# Patient Record
Sex: Male | Born: 1965
Health system: Southern US, Community
[De-identification: ages and names within clinical notes are randomized; demographics above are authoritative.]

## PROBLEM LIST (undated history)

## (undated) DIAGNOSIS — L0390 Cellulitis, unspecified: Secondary | ICD-10-CM

## (undated) DIAGNOSIS — I10 Essential (primary) hypertension: Secondary | ICD-10-CM

## (undated) HISTORY — PX: TONSILLECTOMY: SUR1361

## (undated) HISTORY — DX: Essential (primary) hypertension: I10

---

## 1999-07-29 ENCOUNTER — Emergency Department (HOSPITAL_COMMUNITY): Admission: EM | Admit: 1999-07-29 | Discharge: 1999-07-29 | Payer: Self-pay | Admitting: Emergency Medicine

## 2001-08-30 ENCOUNTER — Emergency Department (HOSPITAL_COMMUNITY): Admission: EM | Admit: 2001-08-30 | Discharge: 2001-08-30 | Payer: Self-pay | Admitting: *Deleted

## 2004-12-01 ENCOUNTER — Emergency Department (HOSPITAL_COMMUNITY): Admission: EM | Admit: 2004-12-01 | Discharge: 2004-12-01 | Payer: Self-pay | Admitting: Emergency Medicine

## 2012-06-11 ENCOUNTER — Emergency Department (HOSPITAL_COMMUNITY)
Admission: EM | Admit: 2012-06-11 | Discharge: 2012-06-11 | Disposition: A | Discharge status: Home / Self Care | Payer: Self-pay | Attending: Emergency Medicine | Admitting: Emergency Medicine

## 2012-06-11 ENCOUNTER — Encounter (HOSPITAL_COMMUNITY): Payer: Self-pay | Admitting: Emergency Medicine

## 2012-06-11 DIAGNOSIS — R45851 Suicidal ideations: Secondary | ICD-10-CM | POA: Insufficient documentation

## 2012-06-11 NOTE — Progress Notes (Signed)
[facility] ED CM consulted with [facility] liaison Noted pt d/c spoke with triage RN, Stark Jock to confirm pt d/c back to [facility]
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
## 2012-06-11 NOTE — ED Notes (Addendum)
Per [employer]: Pt's mother called [employer] because pt was arguing with mother.  Pt states that he wants to be shot by cops.  Pt has lost his job, has "girl problems", and wanted to take his mom's TV to pawn it off.  Pt's mother is here.  Pt denies being suicidal right now but told [employer] that he wanted them to shoot him.  Pt is "emergency commitment".

## 2012-06-11 NOTE — ED Provider Notes (Signed)
Medical screening examination/treatment/procedure(s) were performed by non-physician practitioner and as supervising physician I was immediately available for consultation/collaboration.  Doug Sou, MD 06/11/12 1651

## 2014-01-02 ENCOUNTER — Emergency Department (HOSPITAL_COMMUNITY)
Admission: EM | Admit: 2014-01-02 | Discharge: 2014-01-02 | Disposition: A | Discharge status: Home / Self Care | Payer: Self-pay | Attending: Emergency Medicine | Admitting: Emergency Medicine

## 2014-01-02 ENCOUNTER — Encounter (HOSPITAL_COMMUNITY): Payer: Self-pay | Admitting: *Deleted

## 2014-01-02 DIAGNOSIS — Y998 Other external cause status: Secondary | ICD-10-CM | POA: Insufficient documentation

## 2014-01-02 DIAGNOSIS — Y92410 Unspecified street and highway as the place of occurrence of the external cause: Secondary | ICD-10-CM | POA: Insufficient documentation

## 2014-01-02 DIAGNOSIS — S199XXA Unspecified injury of neck, initial encounter: Secondary | ICD-10-CM | POA: Insufficient documentation

## 2014-01-02 DIAGNOSIS — Y9389 Activity, other specified: Secondary | ICD-10-CM | POA: Insufficient documentation

## 2014-01-02 DIAGNOSIS — R000 Tachycardia, unspecified: Secondary | ICD-10-CM | POA: Insufficient documentation

## 2014-01-02 MED ORDER — ACETAMINOPHEN 500 MG PO TABS
500.0000 mg | ORAL_TABLET | Freq: Four times a day (QID) | ORAL | Status: DC | PRN
Start: 2014-01-02 — End: 2021-11-06

## 2014-01-02 MED ORDER — ACETAMINOPHEN 325 MG PO TABS
650.0000 mg | ORAL_TABLET | Freq: Once | ORAL | Status: AC
  Administered 2014-01-02: 650 mg via ORAL
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
## 2014-12-22 ENCOUNTER — Encounter (HOSPITAL_COMMUNITY): Payer: Self-pay | Admitting: *Deleted

## 2014-12-22 ENCOUNTER — Emergency Department (HOSPITAL_COMMUNITY)
Admission: EM | Admit: 2014-12-22 | Discharge: 2014-12-22 | Discharge status: Against Medical Advice | Payer: Self-pay | Attending: Emergency Medicine | Admitting: Emergency Medicine

## 2014-12-22 DIAGNOSIS — K0889 Other specified disorders of teeth and supporting structures: Secondary | ICD-10-CM | POA: Insufficient documentation

## 2014-12-22 NOTE — ED Notes (Signed)
PT states that he began with achiness to his left lower jaw last Thurs; pt states that it has progressed to the left side of face hurting; pt states "I think my wisdom tooth is infected"

## 2015-09-01 ENCOUNTER — Ambulatory Visit: Payer: Self-pay | Admitting: Internal Medicine

## 2016-02-18 ENCOUNTER — Ambulatory Visit: Payer: Self-pay | Admitting: Nurse Practitioner

## 2016-03-10 ENCOUNTER — Encounter: Payer: Self-pay | Admitting: Nurse Practitioner

## 2016-03-10 ENCOUNTER — Encounter: Payer: Self-pay | Admitting: Gastroenterology

## 2016-03-10 ENCOUNTER — Ambulatory Visit (INDEPENDENT_AMBULATORY_CARE_PROVIDER_SITE_OTHER): Payer: BLUE CROSS/BLUE SHIELD | Admitting: Nurse Practitioner

## 2016-03-10 VITALS — BP 182/120 | HR 99 | Temp 98.4°F | Ht 78.0 in | Wt 233.0 lb

## 2016-03-10 DIAGNOSIS — Z23 Encounter for immunization: Secondary | ICD-10-CM | POA: Diagnosis not present

## 2016-03-10 DIAGNOSIS — Z125 Encounter for screening for malignant neoplasm of prostate: Secondary | ICD-10-CM | POA: Diagnosis not present

## 2016-03-10 DIAGNOSIS — I10 Essential (primary) hypertension: Secondary | ICD-10-CM | POA: Diagnosis not present

## 2016-03-10 DIAGNOSIS — Z1211 Encounter for screening for malignant neoplasm of colon: Secondary | ICD-10-CM

## 2016-03-10 DIAGNOSIS — L602 Onychogryphosis: Secondary | ICD-10-CM | POA: Diagnosis not present

## 2016-03-10 DIAGNOSIS — L603 Nail dystrophy: Secondary | ICD-10-CM | POA: Insufficient documentation

## 2016-03-10 DIAGNOSIS — Z0001 Encounter for general adult medical examination with abnormal findings: Secondary | ICD-10-CM

## 2016-03-10 DIAGNOSIS — L608 Other nail disorders: Secondary | ICD-10-CM

## 2016-03-10 MED ORDER — HYDROCHLOROTHIAZIDE 12.5 MG PO TABS: 12.5000 mg | ORAL_TABLET | Freq: Every day | ORAL | 3 refills | Status: DC

## 2016-03-10 MED ORDER — AMLODIPINE BESYLATE 5 MG PO TABS: 5.0000 mg | ORAL_TABLET | Freq: Every day | ORAL | 3 refills | Status: DC

## 2016-03-10 MED ORDER — ONE-A-DAY MENS PO TABS: 1.0000 | ORAL_TABLET | Freq: Every day | ORAL | 0 refills | Status: DC

## 2016-03-10 MED ORDER — CLONIDINE HCL 0.1 MG PO TABS
0.2000 mg | ORAL_TABLET | Freq: Once | ORAL | Status: AC
  Administered 2016-03-10: 0.2 mg via ORAL

## 2016-03-10 MED ORDER — B COMPLEX-C PO TABS: 1.0000 | ORAL_TABLET | Freq: Every day | ORAL | 0 refills | Status: DC

## 2016-03-10 MED ORDER — CLONIDINE HCL 0.1 MG PO TABS: 0.1000 mg | ORAL_TABLET | Freq: Once | ORAL | Status: DC

## 2016-03-10 NOTE — Progress Notes (Signed)
Pre visit review using our clinic review tool, if applicable. No additional management support is needed unless otherwise documented below in the visit note. 

## 2016-03-10 NOTE — Patient Instructions (Addendum)
Go to basement for blood draw. You will be called with results.  You will be called to schedule colonoscopy.  Start BP medications today.   Consider referral to dermatologist if labs are normal (to rule out psoriasis)  Hypertension Hypertension, commonly called high blood pressure, is when the force of blood pumping through your arteries is too strong. Your arteries are the blood vessels that carry blood from your heart throughout your body. A blood pressure reading consists of a higher number over a lower number, such as 110/72. The higher number (systolic) is the pressure inside your arteries when your heart pumps. The lower number (diastolic) is the pressure inside your arteries when your heart relaxes. Ideally you want your blood pressure below 120/80. Hypertension forces your heart to work harder to pump blood. Your arteries may become narrow or stiff. Having untreated or uncontrolled hypertension can cause heart attack, stroke, kidney disease, and other problems. What increases the risk? Some risk factors for high blood pressure are controllable. Others are not. Risk factors you cannot control include:  Race. You may be at higher risk if you are African American.  Age. Risk increases with age.  Gender. Men are at higher risk than women before age 13 years. After age 60, women are at higher risk than men. Risk factors you can control include:  Not getting enough exercise or physical activity.  Being overweight.  Getting too much fat, sugar, calories, or salt in your diet.  Drinking too much alcohol. What are the signs or symptoms? Hypertension does not usually cause signs or symptoms. Extremely high blood pressure (hypertensive crisis) may cause headache, anxiety, shortness of breath, and nosebleed. How is this diagnosed? To check if you have hypertension, your health care provider will measure your blood pressure while you are seated, with your arm held at the level of your  heart. It should be measured at least twice using the same arm. Certain conditions can cause a difference in blood pressure between your right and left arms. A blood pressure reading that is higher than normal on one occasion does not mean that you need treatment. If it is not clear whether you have high blood pressure, you may be asked to return on a different day to have your blood pressure checked again. Or, you may be asked to monitor your blood pressure at home for 1 or more weeks. How is this treated? Treating high blood pressure includes making lifestyle changes and possibly taking medicine. Living a healthy lifestyle can help lower high blood pressure. You may need to change some of your habits. Lifestyle changes may include:  Following the DASH diet. This diet is high in fruits, vegetables, and whole grains. It is low in salt, red meat, and added sugars.  Keep your sodium intake below 2,300 mg per day.  Getting at least 30-45 minutes of aerobic exercise at least 4 times per week.  Losing weight if necessary.  Not smoking.  Limiting alcoholic beverages.  Learning ways to reduce stress. Your health care provider may prescribe medicine if lifestyle changes are not enough to get your blood pressure under control, and if one of the following is true:  You are 9-66 years of age and your systolic blood pressure is above 140.  You are 52 years of age or older, and your systolic blood pressure is above 150.  Your diastolic blood pressure is above 90.  You have diabetes, and your systolic blood pressure is over 140 or your diastolic  blood pressure is over 90.  You have kidney disease and your blood pressure is above 140/90.  You have heart disease and your blood pressure is above 140/90. Your personal target blood pressure may vary depending on your medical conditions, your age, and other factors. Follow these instructions at home:  Have your blood pressure rechecked as directed by  your health care provider.  Take medicines only as directed by your health care provider. Follow the directions carefully. Blood pressure medicines must be taken as prescribed. The medicine does not work as well when you skip doses. Skipping doses also puts you at risk for problems.  Do not smoke.  Monitor your blood pressure at home as directed by your health care provider. Contact a health care provider if:  You think you are having a reaction to medicines taken.  You have recurrent headaches or feel dizzy.  You have swelling in your ankles.  You have trouble with your vision. Get help right away if:  You develop a severe headache or confusion.  You have unusual weakness, numbness, or feel faint.  You have severe chest or abdominal pain.  You vomit repeatedly.  You have trouble breathing. This information is not intended to replace advice given to you by your health care provider. Make sure you discuss any questions you have with your health care provider. Document Released: 01/16/2005 Document Revised: 06/24/2015 Document Reviewed: 11/08/2012 Elsevier Interactive Patient Education  2017 [employer]    Heart-Healthy Eating Plan Many factors influence your heart health, including eating and exercise habits. Heart (coronary) risk increases with abnormal blood fat (lipid) levels. Heart-healthy meal planning includes limiting unhealthy fats, increasing healthy fats, and making other small dietary changes. This includes maintaining a healthy body weight to help keep lipid levels within a normal range. What is my plan? Your health care provider recommends that you:  Get no more than _________% of the total calories in your daily diet from fat.  Limit your intake of saturated fat to less than _________% of your total calories each day.  Limit the amount of cholesterol in your diet to less than _________ mg per day. What types of fat should I choose?  Choose healthy fats  more often. Choose monounsaturated and polyunsaturated fats, such as olive oil and canola oil, flaxseeds, walnuts, almonds, and seeds.  Eat more omega-3 fats. Good choices include salmon, mackerel, sardines, tuna, flaxseed oil, and ground flaxseeds. Aim to eat fish at least two times each week.  Limit saturated fats. Saturated fats are primarily found in animal products, such as meats, butter, and cream. Plant sources of saturated fats include palm oil, palm kernel oil, and coconut oil.  Avoid foods with partially hydrogenated oils in them. These contain trans fats. Examples of foods that contain trans fats are stick margarine, some tub margarines, cookies, crackers, and other baked goods. What general guidelines do I need to follow?  Check food labels carefully to identify foods with trans fats or high amounts of saturated fat.  Fill one half of your plate with vegetables and green salads. Eat 4-5 servings of vegetables per day. A serving of vegetables equals 1 cup of raw leafy vegetables,  cup of raw or cooked cut-up vegetables, or  cup of vegetable juice.  Fill one fourth of your plate with whole grains. Look for the word "whole" as the first word in the ingredient list.  Fill one fourth of your plate with lean protein foods.  Eat 4-5 servings of fruit  per day. A serving of fruit equals one medium whole fruit,  cup of dried fruit,  cup of fresh, frozen, or canned fruit, or  cup of 100% fruit juice.  Eat more foods that contain soluble fiber. Examples of foods that contain this type of fiber are apples, broccoli, carrots, beans, peas, and barley. Aim to get 20-30 g of fiber per day.  Eat more home-cooked food and less restaurant, buffet, and fast food.  Limit or avoid alcohol.  Limit foods that are high in starch and sugar.  Avoid fried foods.  Cook foods by using methods other than frying. Baking, boiling, grilling, and broiling are all great options. Other fat-reducing  suggestions include:  Removing the skin from poultry.  Removing all visible fats from meats.  Skimming the fat off of stews, soups, and gravies before serving them.  Steaming vegetables in water or broth.  Lose weight if you are overweight. Losing just 5-10% of your initial body weight can help your overall health and prevent diseases such as diabetes and heart disease.  Increase your consumption of nuts, legumes, and seeds to 4-5 servings per week. One serving of dried beans or legumes equals  cup after being cooked, one serving of nuts equals 1 ounces, and one serving of seeds equals  ounce or 1 tablespoon.  You may need to monitor your salt (sodium) intake, especially if you have high blood pressure. Talk with your health care provider or dietitian to get more information about reducing sodium. What foods can I eat? Grains  Breads, including [place], white, pita, wheat, raisin, rye, oatmeal, and [place]. Tortillas that are neither fried nor made with lard or trans fat. Low-fat rolls, including hotdog and hamburger buns and English muffins. Biscuits. Muffins. Waffles. Pancakes. Light popcorn. Whole-grain cereals. Flatbread. Melba toast. Pretzels. Breadsticks. Rusks. Low-fat snacks and crackers, including oyster, saltine, matzo, graham, animal, and rye. Rice and pasta, including brown rice and those that are made with whole wheat. Vegetables  All vegetables. Fruits  All fruits, but limit coconut. Meats and Other Protein Sources  Lean, well-trimmed beef, veal, pork, and lamb. Chicken and [place] without skin. All fish and shellfish. Wild duck, rabbit, pheasant, and venison. Egg whites or low-cholesterol egg substitutes. Dried beans, peas, lentils, and tofu.Seeds and most nuts. Dairy  Low-fat or nonfat cheeses, including ricotta, string, and mozzarella. Skim or 1% milk that is liquid, powdered, or evaporated. Buttermilk that is made with low-fat milk. Nonfat or low-fat yogurt. Beverages    Mineral water. Diet carbonated beverages. Sweets and Desserts  Sherbets and fruit ices. Honey, jam, marmalade, jelly, and syrups. Meringues and gelatins. Pure sugar candy, such as hard candy, jelly beans, gumdrops, mints, marshmallows, and small amounts of dark chocolate. [employer]. Eat all sweets and desserts in moderation. Fats and Oils  Nonhydrogenated (trans-free) margarines. Vegetable oils, including soybean, sesame, sunflower, olive, peanut, safflower, corn, canola, and cottonseed. Salad dressings or mayonnaise that are made with a vegetable oil. Limit added fats and oils that you use for cooking, baking, salads, and as spreads. Other  Cocoa powder. Coffee and tea. All seasonings and condiments. The items listed above may not be a complete list of recommended foods or beverages. Contact your dietitian for more options.  What foods are not recommended? Grains  Breads that are made with saturated or trans fats, oils, or whole milk. Croissants. Butter rolls. Cheese breads. Sweet rolls. Donuts. Buttered popcorn. Chow mein noodles. High-fat crackers, such as cheese or butter crackers. Meats and  Other Protein Sources  Fatty meats, such as hotdogs, short ribs, sausage, spareribs, bacon, ribeye roast or steak, and mutton. High-fat deli meats, such as salami and bologna. Caviar. Domestic duck and goose. Organ meats, such as kidney, liver, sweetbreads, brains, gizzard, chitterlings, and heart. Dairy  Cream, sour cream, cream cheese, and creamed cottage cheese. Whole milk cheeses, including blue (bleu), [address], [place], [place], [address], [place], [address], [place], [place], and [place]. Whole or 2% milk that is liquid, evaporated, or condensed. Whole buttermilk. Cream sauce or high-fat cheese sauce. Yogurt that is made from whole milk. Beverages  Regular sodas and drinks with added sugar. Sweets and Desserts  Frosting. Pudding. Cookies. Cakes other than angel food cake. Candy that has milk  chocolate or white chocolate, hydrogenated fat, butter, coconut, or unknown ingredients. Buttered syrups. Full-fat ice cream or ice cream drinks. Fats and Oils  Gravy that has suet, meat fat, or shortening. Cocoa butter, hydrogenated oils, palm oil, coconut oil, palm kernel oil. These can often be found in baked products, candy, fried foods, nondairy creamers, and whipped toppings. Solid fats and shortenings, including bacon fat, salt pork, lard, and butter. Nondairy cream substitutes, such as coffee creamers and sour cream substitutes. Salad dressings that are made of unknown oils, cheese, or sour cream. The items listed above may not be a complete list of foods and beverages to avoid. Contact your dietitian for more information.  This information is not intended to replace advice given to you by your health care provider. Make sure you discuss any questions you have with your health care provider. Document Released: 10/26/2007 Document Revised: 08/06/2015 Document Reviewed: 07/10/2013 Elsevier Interactive Patient Education  2017 [employer].

## 2016-03-10 NOTE — Progress Notes (Signed)
Subjective:    Patient ID: John Velasquez, male    DOB: 1965-02-11, 51 y.o.   MRN: [id number]  Patient presents today for complete physical or establish care (new patient) and HTN  Hypertension  This is a chronic problem. The current episode started more than 1 month ago. The problem is unchanged. The problem is uncontrolled. Pertinent negatives include no anxiety, blurred vision, chest pain, headaches, malaise/fatigue, orthopnea, palpitations, peripheral edema, PND, shortness of breath or sweats. There are no associated agents to hypertension. Risk factors for coronary artery disease include family history and male gender. Past treatments include nothing. There are no compliance problems.    Nail changes: Finger nails have become thicken, white and breaking off. Ongoing for over 1year. Denies any joint pain or stiffness or swelling. No FH of psoriasis. He quit smoking 1year ago.  Immunizations: (TDAP, Hep C screen, Pneumovax, Influenza, zoster)  Health Maintenance  Topic Date Due  . HIV Screening  11/05/1980  . Colon Cancer Screening  11/06/2015  . Flu Shot  04/29/2016*  . Tetanus Vaccine  03/10/2026  *Topic was postponed. The date shown is not the original due date.   Diet:regular Weight:  Wt Readings from Last 3 Encounters:  03/10/16 233 lb (105.7 kg)  12/22/14 210 lb (95.3 kg)  01/02/14 210 lb (95.3 kg)   Exercise:none Fall Risk:denies No flowsheet data found. Home Safety:home alone Depression/Suicide: denies No flowsheet data found. No flowsheet data found. Colonoscopy (every 5-106yrs, >50-40yrs):needed PSA (yearly, >50yrs):needed Vision:needed Dental:needed Advanced Directive: Advanced Directives 12/22/2014  Does Patient Have a Medical Advance Directive? No  Would patient like information on creating a medical advance directive? No - patient declined information   Sexual History (birth control, marital status, STD):divorced, sexually active, condom use, 2 children    Medications and allergies reviewed with patient and updated if appropriate.  Patient Active Problem List   Diagnosis Date Noted  . HTN (hypertension), benign 03/10/2016  . Leukonychia 03/10/2016  . Thickened nails 03/10/2016    Current Outpatient Prescriptions on File Prior to Visit  Medication Sig Dispense Refill  . acetaminophen (TYLENOL) 500 MG tablet Take 1 tablet (500 mg total) by mouth every 6 (six) hours as needed. 30 tablet 0   No current facility-administered medications on file prior to visit.     Past Medical History:  Diagnosis Date  . Hypertension     History reviewed. No pertinent surgical history.  Social History   Social History  . Marital status: Divorced    Spouse name: N/A  . Number of children: N/A  . Years of education: N/A   Social History Main Topics  . Smoking status: Former Smoker    Packs/day: 1.00    Years: 35.00    Types: Cigarettes    Quit date: 02/08/2016  . Smokeless tobacco: Never Used  . Alcohol use No  . Drug use: No  . Sexual activity: Yes    Partners: Female    Birth control/ protection: Condom   Other Topics Concern  . None   Social History Narrative  . None    Family History  Problem Relation Age of Onset  . Cancer Maternal Grandmother     unknown  . Cancer Paternal Grandmother     unknown  . Heart disease Mother   . Hypertension Mother   . Cancer Father     throat  . Alcohol abuse Father         Review of Systems  Constitutional: Negative for fever,  malaise/fatigue and weight loss.  HENT: Negative for congestion and sore throat.   Eyes: Negative for blurred vision.       Negative for visual changes  Respiratory: Negative for cough and shortness of breath.   Cardiovascular: Negative for chest pain, palpitations, orthopnea, leg swelling and PND.  Gastrointestinal: Negative for blood in stool, constipation, diarrhea and heartburn.  Genitourinary: Negative for dysuria, frequency and urgency.    Musculoskeletal: Negative for falls, joint pain and myalgias.  Skin: Negative for itching and rash.       Nail changes  Neurological: Negative for dizziness, sensory change and headaches.  Endo/Heme/Allergies: Does not bruise/bleed easily.  Psychiatric/Behavioral: Negative for depression, substance abuse and suicidal ideas. The patient is not nervous/anxious.     Objective:   Vitals:   03/10/16 0855  BP: (!) 182/120  Pulse: 99  Temp: 98.4 F (36.9 C)    Body mass index is 26.93 kg/m.  ECG: Normal Sinus Rhythm, no ST or T wave abnormality.  Physical Examination:  Physical Exam  Constitutional: He is oriented to person, place, and time and well-developed, well-nourished, and in no distress. No distress.  HENT:  Right Ear: External ear normal.  Left Ear: External ear normal.  Nose: Nose normal.  Mouth/Throat: Oropharynx is clear and moist. No oropharyngeal exudate.  Eyes: Conjunctivae and EOM are normal. Pupils are equal, round, and reactive to light. No scleral icterus.  Neck: Normal range of motion. Neck supple. No thyromegaly present.  Cardiovascular: Normal rate, normal heart sounds and intact distal pulses.   Pulmonary/Chest: Effort normal and breath sounds normal. He exhibits no tenderness.  Abdominal: Soft. Bowel sounds are normal. He exhibits no distension. There is no tenderness.  Musculoskeletal: Normal range of motion. He exhibits no edema or tenderness.  Lymphadenopathy:    He has no cervical adenopathy.  Neurological: He is alert and oriented to person, place, and time. Gait normal.  Skin: Skin is warm, dry and intact. No rash noted. No cyanosis. Nails show no clubbing.  Hypertrophic finger nails with horizontal groves, white nailbed.  Psychiatric: Affect and judgment normal.    ASSESSMENT and PLAN:  Diagnoses and all orders for this visit:  Encounter for preventative adult health care exam with abnormal findings -     Discontinue: cloNIDine (CATAPRES)  tablet 0.1 mg; Take 1 tablet (0.1 mg total) by mouth once. -     HIV antibody; Future -     PSA; Future -     TSH; Future -     Comprehensive metabolic panel; Future -     CBC w/Diff; Future -     Vitamin D 1,25 dihydroxy; Future -     Lipid panel; Future -     EKG 12-Lead -     Ambulatory referral to Gastroenterology  HTN (hypertension), benign -     Discontinue: cloNIDine (CATAPRES) tablet 0.1 mg; Take 1 tablet (0.1 mg total) by mouth once. -     EKG 12-Lead -     amLODipine (NORVASC) 5 MG tablet; Take 1 tablet (5 mg total) by mouth daily. -     hydrochlorothiazide (HYDRODIURIL) 12.5 MG tablet; Take 1 tablet (12.5 mg total) by mouth daily. -     cloNIDine (CATAPRES) tablet 0.2 mg; Take 2 tablets (0.2 mg total) by mouth once.  Screening for prostate cancer -     PSA; Future  Colon cancer screening -     Ambulatory referral to Gastroenterology  Leukonychia -     Vitamin  D 1,25 dihydroxy; Future -     Ambulatory referral to Dermatology -     B Complex-C (B-COMPLEX WITH VITAMIN C) tablet; Take 1 tablet by mouth daily. -     multivitamin (ONE-A-DAY MEN'S) TABS tablet; Take 1 tablet by mouth daily.  Need for Tdap vaccination -     Tdap vaccine greater than or equal to 7yo IM  Thickened nails -     Ambulatory referral to Dermatology   No problem-specific Assessment & Plan notes found for this encounter.     Follow up: Return in about 1 week (around 03/17/2016) for HTN.  Alysia Penna, NP

## 2016-03-17 ENCOUNTER — Encounter: Payer: Self-pay | Admitting: Nurse Practitioner

## 2016-03-17 ENCOUNTER — Other Ambulatory Visit (INDEPENDENT_AMBULATORY_CARE_PROVIDER_SITE_OTHER): Payer: BLUE CROSS/BLUE SHIELD

## 2016-03-17 ENCOUNTER — Ambulatory Visit (INDEPENDENT_AMBULATORY_CARE_PROVIDER_SITE_OTHER): Payer: BLUE CROSS/BLUE SHIELD | Admitting: Nurse Practitioner

## 2016-03-17 VITALS — BP 118/80 | HR 106 | Temp 98.5°F | Ht 78.0 in | Wt 234.5 lb

## 2016-03-17 DIAGNOSIS — Z125 Encounter for screening for malignant neoplasm of prostate: Secondary | ICD-10-CM

## 2016-03-17 DIAGNOSIS — E781 Pure hyperglyceridemia: Secondary | ICD-10-CM

## 2016-03-17 DIAGNOSIS — L608 Other nail disorders: Secondary | ICD-10-CM | POA: Diagnosis not present

## 2016-03-17 DIAGNOSIS — Z0001 Encounter for general adult medical examination with abnormal findings: Secondary | ICD-10-CM

## 2016-03-17 DIAGNOSIS — I10 Essential (primary) hypertension: Secondary | ICD-10-CM | POA: Diagnosis not present

## 2016-03-17 LAB — LIPID PANEL
Cholesterol: 238 mg/dL — ABNORMAL HIGH (ref 0–200)
HDL: 40.1 mg/dL (ref >39.00)
NonHDL: 197.76
Total CHOL/HDL Ratio: 6
Triglycerides: 320 mg/dL — ABNORMAL HIGH (ref 0.0–149.0)
VLDL: 64 mg/dL — ABNORMAL HIGH (ref 0.0–40.0)

## 2016-03-17 LAB — CBC WITH DIFFERENTIAL/PLATELET
BASOS ABS: 0.1 10*3/uL (ref 0.0–0.1)
Basophils Relative: 0.6 % (ref 0.0–3.0)
Eosinophils Absolute: 0.5 10*3/uL (ref 0.0–0.7)
Eosinophils Relative: 5.5 % — ABNORMAL HIGH (ref 0.0–5.0)
HCT: 44.4 % (ref 39.0–52.0)
Hemoglobin: 14.7 g/dL (ref 13.0–17.0)
LYMPHS ABS: 1.6 10*3/uL (ref 0.7–4.0)
LYMPHS PCT: 18.5 % (ref 12.0–46.0)
MCHC: 33 g/dL (ref 30.0–36.0)
MCV: 82.2 fl (ref 78.0–100.0)
Monocytes Absolute: 0.9 10*3/uL (ref 0.1–1.0)
Monocytes Relative: 10.8 % (ref 3.0–12.0)
NEUTROS PCT: 64.6 % (ref 43.0–77.0)
Neutro Abs: 5.6 10*3/uL (ref 1.4–7.7)
Platelets: 136 10*3/uL — ABNORMAL LOW (ref 150.0–400.0)
RBC: 5.4 Mil/uL (ref 4.22–5.81)
RDW: 14.5 % (ref 11.5–15.5)
WBC: 8.6 10*3/uL (ref 4.0–10.5)

## 2016-03-17 LAB — COMPREHENSIVE METABOLIC PANEL
ALK PHOS: 63 U/L (ref 39–117)
ALT: 19 U/L (ref 0–53)
AST: 19 U/L (ref 0–37)
Albumin: 4.4 g/dL (ref 3.5–5.2)
BUN: 15 mg/dL (ref 6–23)
CALCIUM: 9.8 mg/dL (ref 8.4–10.5)
CO2: 32 mEq/L (ref 19–32)
Chloride: 100 mEq/L (ref 96–112)
Creatinine, Ser: 0.97 mg/dL (ref 0.40–1.50)
GFR: 105.21 mL/min (ref >60.00)
GLUCOSE: 96 mg/dL (ref 70–99)
Potassium: 4.1 mEq/L (ref 3.5–5.1)
Sodium: 140 mEq/L (ref 135–145)
TOTAL PROTEIN: 7 g/dL (ref 6.0–8.3)
Total Bilirubin: 0.4 mg/dL (ref 0.2–1.2)

## 2016-03-17 LAB — LDL CHOLESTEROL, DIRECT: Direct LDL: 80 mg/dL

## 2016-03-17 LAB — PSA: PSA: 0.7 ng/mL (ref 0.10–4.00)

## 2016-03-17 LAB — TSH: TSH: 1.8 u[IU]/mL (ref 0.35–4.50)

## 2016-03-17 NOTE — Patient Instructions (Addendum)
Go to basement for lab draw. You will be called with results.   Hypertension Hypertension, commonly called high blood pressure, is when the force of blood pumping through your arteries is too strong. Your arteries are the blood vessels that carry blood from your heart throughout your body. A blood pressure reading consists of a higher number over a lower number, such as 110/72. The higher number (systolic) is the pressure inside your arteries when your heart pumps. The lower number (diastolic) is the pressure inside your arteries when your heart relaxes. Ideally you want your blood pressure below 120/80. Hypertension forces your heart to work harder to pump blood. Your arteries may become narrow or stiff. Having untreated or uncontrolled hypertension can cause heart attack, stroke, kidney disease, and other problems. What increases the risk? Some risk factors for high blood pressure are controllable. Others are not. Risk factors you cannot control include:  Race. You may be at higher risk if you are African American.  Age. Risk increases with age.  Gender. Men are at higher risk than women before age 35 years. After age 20, women are at higher risk than men. Risk factors you can control include:  Not getting enough exercise or physical activity.  Being overweight.  Getting too much fat, sugar, calories, or salt in your diet.  Drinking too much alcohol. What are the signs or symptoms? Hypertension does not usually cause signs or symptoms. Extremely high blood pressure (hypertensive crisis) may cause headache, anxiety, shortness of breath, and nosebleed. How is this diagnosed? To check if you have hypertension, your health care provider will measure your blood pressure while you are seated, with your arm held at the level of your heart. It should be measured at least twice using the same arm. Certain conditions can cause a difference in blood pressure between your right and left arms. A  blood pressure reading that is higher than normal on one occasion does not mean that you need treatment. If it is not clear whether you have high blood pressure, you may be asked to return on a different day to have your blood pressure checked again. Or, you may be asked to monitor your blood pressure at home for 1 or more weeks. How is this treated? Treating high blood pressure includes making lifestyle changes and possibly taking medicine. Living a healthy lifestyle can help lower high blood pressure. You may need to change some of your habits. Lifestyle changes may include:  Following the DASH diet. This diet is high in fruits, vegetables, and whole grains. It is low in salt, red meat, and added sugars.  Keep your sodium intake below 2,300 mg per day.  Getting at least 30-45 minutes of aerobic exercise at least 4 times per week.  Losing weight if necessary.  Not smoking.  Limiting alcoholic beverages.  Learning ways to reduce stress. Your health care provider may prescribe medicine if lifestyle changes are not enough to get your blood pressure under control, and if one of the following is true:  You are 105-96 years of age and your systolic blood pressure is above 140.  You are 72 years of age or older, and your systolic blood pressure is above 150.  Your diastolic blood pressure is above 90.  You have diabetes, and your systolic blood pressure is over 140 or your diastolic blood pressure is over 90.  You have kidney disease and your blood pressure is above 140/90.  You have heart disease and your blood pressure  is above 140/90. Your personal target blood pressure may vary depending on your medical conditions, your age, and other factors. Follow these instructions at home:  Have your blood pressure rechecked as directed by your health care provider.  Take medicines only as directed by your health care provider. Follow the directions carefully. Blood pressure medicines must be  taken as prescribed. The medicine does not work as well when you skip doses. Skipping doses also puts you at risk for problems.  Do not smoke.  Monitor your blood pressure at home as directed by your health care provider. Contact a health care provider if:  You think you are having a reaction to medicines taken.  You have recurrent headaches or feel dizzy.  You have swelling in your ankles.  You have trouble with your vision. Get help right away if:  You develop a severe headache or confusion.  You have unusual weakness, numbness, or feel faint.  You have severe chest or abdominal pain.  You vomit repeatedly.  You have trouble breathing. This information is not intended to replace advice given to you by your health care provider. Make sure you discuss any questions you have with your health care provider. Document Released: 01/16/2005 Document Revised: 06/24/2015 Document Reviewed: 11/08/2012 Elsevier Interactive Patient Education  2017 [employer].

## 2016-03-17 NOTE — Progress Notes (Signed)
   Subjective:  Patient ID: John Velasquez, male    DOB: 13-Feb-1965  Age: 51 y.o. MRN: [id number]  CC: Hypertension (follow up)    Hypertension  This is a new problem. The current episode started more than 1 month ago. The problem has been gradually improving since onset. The problem is controlled. Pertinent negatives include no anxiety, blurred vision, chest pain, headaches, malaise/fatigue, neck pain, orthopnea, palpitations, peripheral edema, PND, shortness of breath or sweats. There are no associated agents to hypertension. Risk factors for coronary artery disease include male gender. Past treatments include calcium channel blockers and diuretics. The current treatment provides significant improvement. There are no compliance problems.     Outpatient Medications Prior to Visit  Medication Sig Dispense Refill  . acetaminophen (TYLENOL) 500 MG tablet Take 1 tablet (500 mg total) by mouth every 6 (six) hours as needed. 30 tablet 0  . amLODipine (NORVASC) 5 MG tablet Take 1 tablet (5 mg total) by mouth daily. 30 tablet 3  . B Complex-C (B-COMPLEX WITH VITAMIN C) tablet Take 1 tablet by mouth daily. 30 tablet 0  . hydrochlorothiazide (HYDRODIURIL) 12.5 MG tablet Take 1 tablet (12.5 mg total) by mouth daily. 30 tablet 3  . multivitamin (ONE-A-DAY MEN'S) TABS tablet Take 1 tablet by mouth daily. 30 tablet 0   No facility-administered medications prior to visit.     ROS See HPI  Objective:  BP 118/80 (BP Location: Left Arm, Patient Position: Sitting, Cuff Size: Large)   Pulse (!) 106   Temp 98.5 F (36.9 C) (Oral)   Ht 6\' 6"  (1.981 m)   Wt 234 lb 8 oz (106.4 kg)   SpO2 95%   BMI 27.10 kg/m   BP Readings from Last 3 Encounters:  03/17/16 118/80  03/10/16 (!) 182/120  12/22/14 (!) 175/121    Wt Readings from Last 3 Encounters:  03/17/16 234 lb 8 oz (106.4 kg)  03/10/16 233 lb (105.7 kg)  12/22/14 210 lb (95.3 kg)    Physical Exam  Constitutional: He is oriented to person,  place, and time. No distress.  Cardiovascular: Normal rate, regular rhythm and normal heart sounds.   Pulmonary/Chest: Effort normal and breath sounds normal.  Musculoskeletal: He exhibits no edema.  Neurological: He is oriented to person, place, and time.  Vitals reviewed.   No results found for: WBC, HGB, HCT, PLT, GLUCOSE, CHOL, TRIG, HDL, LDLDIRECT, LDLCALC, ALT, AST, NA, K, CL, CREATININE, BUN, CO2, TSH, PSA, INR, GLUF, HGBA1C, MICROALBUR  No results found.  Assessment & Plan:   Arvind was seen today for hypertension.  Diagnoses and all orders for this visit:  HTN (hypertension), benign   I am having Mr. Betzen maintain his acetaminophen, B-complex with vitamin C, multivitamin, amLODipine, and hydrochlorothiazide.  No orders of the defined types were placed in this encounter.   Follow-up: Return in about 6 months (around 09/14/2016) for HTN.  Alysia Penna, NP

## 2016-03-17 NOTE — Assessment & Plan Note (Signed)
Controlled with HCTZ 12.5mg  and amlodipine 5mg 

## 2016-03-17 NOTE — Progress Notes (Signed)
Pre visit review using our clinic review tool, if applicable. No additional management support is needed unless otherwise documented below in the visit note. 

## 2016-03-18 LAB — HIV ANTIBODY (ROUTINE TESTING W REFLEX): HIV: NONREACTIVE

## 2016-03-18 MED ORDER — OMEGA-3-ACID ETHYL ESTERS 1 G PO CAPS: 1.0000 g | ORAL_CAPSULE | Freq: Two times a day (BID) | ORAL | 5 refills | Status: DC

## 2016-03-18 MED ORDER — FENOFIBRATE 145 MG PO TABS: 145.0000 mg | ORAL_TABLET | Freq: Every day | ORAL | 5 refills | Status: DC

## 2016-03-20 LAB — VITAMIN D 1,25 DIHYDROXY
Vitamin D 1, 25 (OH)2 Total: 45 pg/mL (ref 18–72)
Vitamin D2 1, 25 (OH)2: <8 pg/mL
Vitamin D3 1, 25 (OH)2: 45 pg/mL

## 2016-04-14 DIAGNOSIS — L603 Nail dystrophy: Secondary | ICD-10-CM | POA: Diagnosis not present

## 2016-04-14 DIAGNOSIS — B351 Tinea unguium: Secondary | ICD-10-CM | POA: Diagnosis not present

## 2016-04-14 DIAGNOSIS — B353 Tinea pedis: Secondary | ICD-10-CM | POA: Diagnosis not present

## 2016-04-28 ENCOUNTER — Encounter: Payer: Self-pay | Admitting: Nurse Practitioner

## 2016-05-12 ENCOUNTER — Encounter: Payer: BLUE CROSS/BLUE SHIELD | Admitting: Gastroenterology

## 2016-09-15 ENCOUNTER — Ambulatory Visit: Payer: BLUE CROSS/BLUE SHIELD | Admitting: Nurse Practitioner

## 2017-03-21 ENCOUNTER — Other Ambulatory Visit: Payer: Self-pay

## 2017-03-21 ENCOUNTER — Emergency Department (HOSPITAL_COMMUNITY)
Admission: EM | Admit: 2017-03-21 | Discharge: 2017-03-21 | Disposition: A | Discharge status: Home / Self Care | Payer: BLUE CROSS/BLUE SHIELD | Attending: Emergency Medicine | Admitting: Emergency Medicine

## 2017-03-21 ENCOUNTER — Encounter (HOSPITAL_COMMUNITY): Payer: Self-pay | Admitting: *Deleted

## 2017-03-21 DIAGNOSIS — I10 Essential (primary) hypertension: Secondary | ICD-10-CM | POA: Insufficient documentation

## 2017-03-21 DIAGNOSIS — M79672 Pain in left foot: Secondary | ICD-10-CM | POA: Diagnosis not present

## 2017-03-21 DIAGNOSIS — M79671 Pain in right foot: Secondary | ICD-10-CM | POA: Diagnosis not present

## 2017-03-21 DIAGNOSIS — Z79899 Other long term (current) drug therapy: Secondary | ICD-10-CM | POA: Diagnosis not present

## 2017-03-21 DIAGNOSIS — B353 Tinea pedis: Secondary | ICD-10-CM | POA: Diagnosis not present

## 2017-03-21 DIAGNOSIS — Z87891 Personal history of nicotine dependence: Secondary | ICD-10-CM | POA: Insufficient documentation

## 2017-03-21 LAB — CBC
HCT: 45.4 % (ref 39.0–52.0)
Hemoglobin: 15.2 g/dL (ref 13.0–17.0)
MCH: 27.4 pg (ref 26.0–34.0)
MCHC: 33.5 g/dL (ref 30.0–36.0)
MCV: 81.9 fL (ref 78.0–100.0)
Platelets: 121 10*3/uL — ABNORMAL LOW (ref 150–400)
RBC: 5.54 MIL/uL (ref 4.22–5.81)
RDW: 15.3 % (ref 11.5–15.5)
WBC: 8.2 10*3/uL (ref 4.0–10.5)

## 2017-03-21 LAB — BASIC METABOLIC PANEL
ANION GAP: 9 (ref 5–15)
BUN: 10 mg/dL (ref 6–20)
CHLORIDE: 106 mmol/L (ref 101–111)
CO2: 25 mmol/L (ref 22–32)
CREATININE: 0.81 mg/dL (ref 0.61–1.24)
Calcium: 9.2 mg/dL (ref 8.9–10.3)
GFR calc Af Amer: >60 mL/min (ref >60)
GFR calc non Af Amer: >60 mL/min (ref >60)
Glucose, Bld: 129 mg/dL — ABNORMAL HIGH (ref 65–99)
Potassium: 3.9 mmol/L (ref 3.5–5.1)
Sodium: 140 mmol/L (ref 135–145)

## 2017-03-21 MED ORDER — CLOTRIMAZOLE 1 % EX CREA: TOPICAL_CREAM | CUTANEOUS | 0 refills | Status: DC

## 2017-03-21 MED ORDER — CLOTRIMAZOLE 1 % EX CREA
TOPICAL_CREAM | Freq: Two times a day (BID) | CUTANEOUS | Status: DC
  Administered 2017-03-21: 13:00:00 via TOPICAL
  Filled 2017-03-21: qty 15

## 2017-03-21 MED ORDER — AMLODIPINE BESYLATE 5 MG PO TABS: 5.0000 mg | ORAL_TABLET | Freq: Every day | ORAL | 0 refills | Status: DC

## 2017-03-21 NOTE — ED Provider Notes (Signed)
[facility] EMERGENCY DEPARTMENT Provider Note   CSN: [id number] Arrival date & time: 03/21/17  4098     History   Chief Complaint Chief Complaint  Patient presents with  . Foot Pain    HPI John Velasquez is a 52 y.o. male.  Patient c/o bil foot pain for past several weeks, getting progressively worse. Symptoms moderate, persistent, worsening. States at work, wears boots, and lately feet have been wet and draining. Hx athletes foot, not treated. Hx htn, off meds x months. Denies hx diabetes. No fever or chills. Denies leg swelling. No chest pain or sob.    The history is provided by the patient.  Foot Pain  Pertinent negatives include no chest pain, no abdominal pain, no headaches and no shortness of breath.    Past Medical History:  Diagnosis Date  . Hypertension     Patient Active Problem List   Diagnosis Date Noted  . HTN (hypertension), benign 03/10/2016  . Leukonychia 03/10/2016  . 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    History reviewed. No pertinent surgical history.     Home Medications    Prior to Admission medications   Medication Sig Start Date End Date Taking? Authorizing Provider  acetaminophen (TYLENOL) 500 MG tablet Take 1 tablet (500 mg total) by mouth every 6 (six) hours as needed. 01/02/14   Cartner, Sharlet Salina, PA-C  amLODipine (NORVASC) 5 MG tablet Take 1 tablet (5 mg total) by mouth daily. 03/10/16   Nche, Bonna Gains, NP  B Complex-C (B-COMPLEX WITH VITAMIN C) tablet Take 1 tablet by mouth daily. 03/10/16   Nche, Bonna Gains, NP  fenofibrate (TRICOR) 145 MG tablet Take 1 tablet (145 mg total) by mouth daily. 03/18/16   Nche, Bonna Gains, NP  hydrochlorothiazide (HYDRODIURIL) 12.5 MG tablet Take 1 tablet (12.5 mg total) by mouth daily. 03/10/16   Nche, Bonna Gains, NP  multivitamin (ONE-A-DAY MEN'S) TABS tablet Take 1 tablet by mouth daily. 03/10/16   Nche, Bonna Gains, NP  omega-3 acid ethyl esters (LOVAZA) 1 g capsule Take 1 capsule  (1 g total) by mouth 2 (two) times daily. 03/18/16   Nche, Bonna Gains, NP    Family History Family History  Problem Relation Age of Onset  . Cancer Maternal Grandmother        unknown  . Cancer Paternal Grandmother        unknown  . Heart disease Mother   . Hypertension Mother   . Cancer Father        throat  . Alcohol abuse Father     Social History Social History   Tobacco Use  . Smoking status: Former Smoker    Packs/day: 1.00    Years: 35.00    Pack years: 35.00    Types: Cigarettes    Last attempt to quit: 02/08/2016    Years since quitting: 1.1  . Smokeless tobacco: Never Used  Substance Use Topics  . Alcohol use: No  . Drug use: No     Allergies   Patient has no known allergies.   Review of Systems Review of Systems  Constitutional: Negative for fever.  HENT: Negative for sore throat.   Eyes: Negative for redness.  Respiratory: Negative for shortness of breath.   Cardiovascular: Negative for chest pain.  Gastrointestinal: Negative for abdominal pain.  Endocrine: Negative for polyuria.  Genitourinary: Negative for flank pain.  Musculoskeletal: Negative for back pain.  Skin: Negative for rash.  Neurological: Negative for headaches.  Hematological: Does not bruise/bleed easily.  Psychiatric/Behavioral: Negative for confusion.     Physical Exam Updated Vital Signs BP (!) 187/133 (BP Location: Right Arm)   Pulse 85   Temp 98.3 F (36.8 C) (Oral)   Resp 18   SpO2 100%   Physical Exam  Constitutional: He appears well-developed and well-nourished. No distress.  HENT:  Mouth/Throat: Oropharynx is clear and moist.  Eyes: Conjunctivae are normal.  Neck: Neck supple. No tracheal deviation present.  Cardiovascular: Normal rate, regular rhythm, normal heart sounds and intact distal pulses. Exam reveals no gallop and no friction rub.  No murmur heard. Pulmonary/Chest: Effort normal and breath sounds normal. No accessory muscle usage. No respiratory  distress.  Abdominal: He exhibits no distension.  Musculoskeletal: He exhibits no edema.  No leg edema. Distal pulses palp bil.   Neurological: He is alert.  Skin: Skin is warm and dry. He is not diaphoretic.  Patient with macerated areas between toes, and opens areas on skin dorsally, draining clear fluid. Changes bil feet/toes c/w severe athletes foot. No necrotic or devitalized tissue.   Psychiatric: He has a normal mood and affect.  Nursing note and vitals reviewed.    ED Treatments / Results  Labs (all labs ordered are listed, but only abnormal results are displayed) Results for orders placed or performed during the hospital encounter of 03/21/17  CBC  Result Value Ref Range   WBC 8.2 4.0 - 10.5 K/uL   RBC 5.54 4.22 - 5.81 MIL/uL   Hemoglobin 15.2 13.0 - 17.0 g/dL   HCT 29.5 18.8 - 41.6 %   MCV 81.9 78.0 - 100.0 fL   MCH 27.4 26.0 - 34.0 pg   MCHC 33.5 30.0 - 36.0 g/dL   RDW 60.6 30.1 - 60.1 %   Platelets 121 (L) 150 - 400 K/uL  Basic metabolic panel  Result Value Ref Range   Sodium 140 135 - 145 mmol/L   Potassium 3.9 3.5 - 5.1 mmol/L   Chloride 106 101 - 111 mmol/L   CO2 25 22 - 32 mmol/L   Glucose, Bld 129 (H) 65 - 99 mg/dL   BUN 10 6 - 20 mg/dL   Creatinine, Ser 0.93 0.61 - 1.24 mg/dL   Calcium 9.2 8.9 - 23.5 mg/dL   GFR calc non Af Amer >60 >60 mL/min   GFR calc Af Amer >60 >60 mL/min   Anion gap 9 5 - 15   EKG  EKG Interpretation None       Radiology No results found.  Procedures Procedures (including critical care time)  Medications Ordered in ED Medications  clotrimazole (LOTRIMIN) 1 % cream (not administered)     Initial Impression / Assessment and Plan / ED Course  I have reviewed the triage vital signs and the nursing notes.  Pertinent labs & imaging results that were available during my care of the patient were reviewed by me and considered in my medical decision making (see chart for details).  Re-check bp.   Hx htn, bp remains  high. Will give dose of po meds. Amlodipine po.  Labs sent.  Toes/feet cleaned thoroughly and dried well.  Thin coat of clotrimazole cream applied.  Reviewed nursing notes and prior charts for additional history.   Recheck bp improved.   Pt has appt with food specialist this Monday.   Final Clinical Impressions(s) / ED Diagnoses   Final diagnoses:  None    ED Discharge Orders    None       Cathren Laine, MD 03/21/17 1434

## 2017-03-21 NOTE — ED Triage Notes (Signed)
Pt reports having rash and blisters to both feet with burning pain. More severe on left foot and now has drainage from his wounds. Denies being diabetic. Is hypertensive at triage, has been off meds x 1 year.

## 2017-03-21 NOTE — Discharge Instructions (Signed)
It was our pleasure to provide your ER care today - we hope that you feel better.  Keep feet very clean and dry for  the next week.  Wash with warm water and mild soap 2x/day, dry feet thoroughly, and then apply a thin coat of clotrimazole cream.  Leave open to area when possible, and definitely keep out of wet socks/shoes/boots.   Follow up with foot specialist Monday.  Overall, your lab tests look good.  Your platelet count is mildly low (121) - follow up with primary care doctor.  Your blood pressure is high - take medication as prescribed, and follow up with primary care doctor in 1 week.   Return to ER if worse, new symptoms, high fevers, spreading redness/infection of feet/legs, other concern.

## 2017-03-26 DIAGNOSIS — B353 Tinea pedis: Secondary | ICD-10-CM | POA: Diagnosis not present

## 2017-03-26 DIAGNOSIS — B351 Tinea unguium: Secondary | ICD-10-CM | POA: Diagnosis not present

## 2017-03-26 DIAGNOSIS — L03116 Cellulitis of left lower limb: Secondary | ICD-10-CM | POA: Diagnosis not present

## 2017-04-12 ENCOUNTER — Inpatient Hospital Stay (HOSPITAL_COMMUNITY)
Admission: EM | Admit: 2017-04-12 | Discharge: 2017-04-15 | DRG: 603 | Disposition: A | Discharge status: Home / Self Care | Payer: BLUE CROSS/BLUE SHIELD | Attending: Internal Medicine | Admitting: Internal Medicine

## 2017-04-12 ENCOUNTER — Ambulatory Visit (HOSPITAL_COMMUNITY)
Admission: EM | Admit: 2017-04-12 | Discharge: 2017-04-12 | Discharge status: Against Medical Advice | Payer: BLUE CROSS/BLUE SHIELD

## 2017-04-12 ENCOUNTER — Encounter (HOSPITAL_COMMUNITY): Payer: Self-pay | Admitting: Emergency Medicine

## 2017-04-12 ENCOUNTER — Other Ambulatory Visit: Payer: Self-pay

## 2017-04-12 DIAGNOSIS — F17210 Nicotine dependence, cigarettes, uncomplicated: Secondary | ICD-10-CM | POA: Diagnosis present

## 2017-04-12 DIAGNOSIS — R000 Tachycardia, unspecified: Secondary | ICD-10-CM

## 2017-04-12 DIAGNOSIS — E872 Acidosis, unspecified: Secondary | ICD-10-CM | POA: Diagnosis present

## 2017-04-12 DIAGNOSIS — Z79899 Other long term (current) drug therapy: Secondary | ICD-10-CM

## 2017-04-12 DIAGNOSIS — Z720 Tobacco use: Secondary | ICD-10-CM | POA: Diagnosis present

## 2017-04-12 DIAGNOSIS — L03115 Cellulitis of right lower limb: Secondary | ICD-10-CM | POA: Diagnosis not present

## 2017-04-12 DIAGNOSIS — E876 Hypokalemia: Secondary | ICD-10-CM | POA: Diagnosis present

## 2017-04-12 DIAGNOSIS — R7303 Prediabetes: Secondary | ICD-10-CM | POA: Diagnosis present

## 2017-04-12 DIAGNOSIS — B353 Tinea pedis: Secondary | ICD-10-CM | POA: Diagnosis not present

## 2017-04-12 DIAGNOSIS — A499 Bacterial infection, unspecified: Secondary | ICD-10-CM | POA: Diagnosis present

## 2017-04-12 DIAGNOSIS — D696 Thrombocytopenia, unspecified: Secondary | ICD-10-CM | POA: Diagnosis present

## 2017-04-12 DIAGNOSIS — L299 Pruritus, unspecified: Secondary | ICD-10-CM | POA: Diagnosis not present

## 2017-04-12 DIAGNOSIS — Z872 Personal history of diseases of the skin and subcutaneous tissue: Secondary | ICD-10-CM | POA: Diagnosis not present

## 2017-04-12 DIAGNOSIS — B359 Dermatophytosis, unspecified: Secondary | ICD-10-CM | POA: Diagnosis present

## 2017-04-12 DIAGNOSIS — L03116 Cellulitis of left lower limb: Secondary | ICD-10-CM | POA: Diagnosis not present

## 2017-04-12 DIAGNOSIS — L03119 Cellulitis of unspecified part of limb: Secondary | ICD-10-CM | POA: Diagnosis not present

## 2017-04-12 DIAGNOSIS — I10 Essential (primary) hypertension: Secondary | ICD-10-CM | POA: Diagnosis not present

## 2017-04-12 DIAGNOSIS — B351 Tinea unguium: Secondary | ICD-10-CM | POA: Diagnosis not present

## 2017-04-12 DIAGNOSIS — L0390 Cellulitis, unspecified: Secondary | ICD-10-CM | POA: Diagnosis not present

## 2017-04-12 DIAGNOSIS — R739 Hyperglycemia, unspecified: Secondary | ICD-10-CM | POA: Diagnosis present

## 2017-04-12 DIAGNOSIS — R21 Rash and other nonspecific skin eruption: Secondary | ICD-10-CM | POA: Diagnosis not present

## 2017-04-12 DIAGNOSIS — Z87891 Personal history of nicotine dependence: Secondary | ICD-10-CM | POA: Diagnosis not present

## 2017-04-12 HISTORY — DX: Cellulitis, unspecified: L03.90

## 2017-04-12 LAB — CBC WITH DIFFERENTIAL/PLATELET
Basophils Absolute: 0 10*3/uL (ref 0.0–0.1)
Basophils Relative: 0 %
EOS PCT: 3 %
Eosinophils Absolute: 0.3 10*3/uL (ref 0.0–0.7)
HCT: 42.6 % (ref 39.0–52.0)
Hemoglobin: 14.3 g/dL (ref 13.0–17.0)
Lymphocytes Relative: 13 %
Lymphs Abs: 1.3 10*3/uL (ref 0.7–4.0)
MCH: 27.2 pg (ref 26.0–34.0)
MCHC: 33.6 g/dL (ref 30.0–36.0)
MCV: 81 fL (ref 78.0–100.0)
Monocytes Absolute: 0.5 10*3/uL (ref 0.1–1.0)
Monocytes Relative: 5 %
Neutro Abs: 7.7 10*3/uL (ref 1.7–7.7)
Neutrophils Relative %: 79 %
PLATELETS: 144 10*3/uL — AB (ref 150–400)
RBC: 5.26 MIL/uL (ref 4.22–5.81)
RDW: 14.7 % (ref 11.5–15.5)
WBC: 9.8 10*3/uL (ref 4.0–10.5)

## 2017-04-12 LAB — I-STAT CG4 LACTIC ACID, ED: Lactic Acid, Venous: 2.06 mmol/L (ref 0.5–1.9)

## 2017-04-12 LAB — COMPREHENSIVE METABOLIC PANEL
ALT: 12 U/L — ABNORMAL LOW (ref 17–63)
AST: 24 U/L (ref 15–41)
Albumin: 3.7 g/dL (ref 3.5–5.0)
Alkaline Phosphatase: 58 U/L (ref 38–126)
Anion gap: 10 (ref 5–15)
BILIRUBIN TOTAL: 1.1 mg/dL (ref 0.3–1.2)
BUN: 8 mg/dL (ref 6–20)
CHLORIDE: 106 mmol/L (ref 101–111)
CO2: 21 mmol/L — ABNORMAL LOW (ref 22–32)
CREATININE: 0.91 mg/dL (ref 0.61–1.24)
Calcium: 8.8 mg/dL — ABNORMAL LOW (ref 8.9–10.3)
GFR calc Af Amer: >60 mL/min (ref >60)
Glucose, Bld: 165 mg/dL — ABNORMAL HIGH (ref 65–99)
Potassium: 3.4 mmol/L — ABNORMAL LOW (ref 3.5–5.1)
Sodium: 137 mmol/L (ref 135–145)
TOTAL PROTEIN: 6.8 g/dL (ref 6.5–8.1)

## 2017-04-12 NOTE — ED Triage Notes (Signed)
Pt reports worsening rash/blisters/ open sores to feet, legs and now has spread to groin. Pt has open wounds noted, with swelling noted to ankles. Pt podiatrist sent him here for sepsis work up. Pt took Augmentin but did not get any better

## 2017-04-12 NOTE — ED Notes (Signed)
Informed first nurse Raynelle Fanning of lactic acid 2.06

## 2017-04-12 NOTE — ED Provider Notes (Signed)
Patient placed in Quick Look pathway, seen and evaluated   Chief Complaint: infected feet   HPI:   Pt has been treated by Podiatrist for fungal and bacterial infection of his feet.  Pt's feet are getting worse.   ROS: no fever, no chills,    Physical Exam:   Gen: No distress  Neuro: Awake and Alert  Skin: Warm    Focused Exam: swollen red feet,  Rash and open sores    Initiation of care has begun. The patient has been counseled on the process, plan, and necessity for staying for the completion/evaluation, and the remainder of the medical screening examination   Osie Cheeks 04/12/17 1551    Arby Barrette, MD 04/13/17 1309

## 2017-04-13 ENCOUNTER — Other Ambulatory Visit: Payer: Self-pay

## 2017-04-13 ENCOUNTER — Encounter (HOSPITAL_COMMUNITY): Payer: Self-pay | Admitting: General Practice

## 2017-04-13 ENCOUNTER — Inpatient Hospital Stay (HOSPITAL_COMMUNITY): Payer: BLUE CROSS/BLUE SHIELD

## 2017-04-13 DIAGNOSIS — Z872 Personal history of diseases of the skin and subcutaneous tissue: Secondary | ICD-10-CM

## 2017-04-13 DIAGNOSIS — A499 Bacterial infection, unspecified: Secondary | ICD-10-CM | POA: Diagnosis present

## 2017-04-13 DIAGNOSIS — D696 Thrombocytopenia, unspecified: Secondary | ICD-10-CM | POA: Diagnosis present

## 2017-04-13 DIAGNOSIS — L03119 Cellulitis of unspecified part of limb: Principal | ICD-10-CM

## 2017-04-13 DIAGNOSIS — E872 Acidosis, unspecified: Secondary | ICD-10-CM | POA: Diagnosis present

## 2017-04-13 DIAGNOSIS — Z87891 Personal history of nicotine dependence: Secondary | ICD-10-CM

## 2017-04-13 DIAGNOSIS — L0390 Cellulitis, unspecified: Secondary | ICD-10-CM | POA: Diagnosis not present

## 2017-04-13 DIAGNOSIS — I10 Essential (primary) hypertension: Secondary | ICD-10-CM | POA: Diagnosis present

## 2017-04-13 DIAGNOSIS — B353 Tinea pedis: Secondary | ICD-10-CM | POA: Diagnosis present

## 2017-04-13 DIAGNOSIS — R7303 Prediabetes: Secondary | ICD-10-CM | POA: Diagnosis present

## 2017-04-13 DIAGNOSIS — E876 Hypokalemia: Secondary | ICD-10-CM | POA: Diagnosis not present

## 2017-04-13 DIAGNOSIS — R21 Rash and other nonspecific skin eruption: Secondary | ICD-10-CM | POA: Diagnosis not present

## 2017-04-13 DIAGNOSIS — R000 Tachycardia, unspecified: Secondary | ICD-10-CM | POA: Diagnosis not present

## 2017-04-13 DIAGNOSIS — L299 Pruritus, unspecified: Secondary | ICD-10-CM

## 2017-04-13 DIAGNOSIS — F17210 Nicotine dependence, cigarettes, uncomplicated: Secondary | ICD-10-CM | POA: Diagnosis present

## 2017-04-13 DIAGNOSIS — R739 Hyperglycemia, unspecified: Secondary | ICD-10-CM

## 2017-04-13 DIAGNOSIS — Z720 Tobacco use: Secondary | ICD-10-CM

## 2017-04-13 DIAGNOSIS — B359 Dermatophytosis, unspecified: Secondary | ICD-10-CM

## 2017-04-13 DIAGNOSIS — Z79899 Other long term (current) drug therapy: Secondary | ICD-10-CM | POA: Diagnosis not present

## 2017-04-13 HISTORY — DX: Cellulitis, unspecified: L03.90

## 2017-04-13 LAB — HEMOGLOBIN A1C
HEMOGLOBIN A1C: 5.7 % — AB (ref 4.8–5.6)
Mean Plasma Glucose: 116.89 mg/dL

## 2017-04-13 LAB — CBC
HCT: 37.1 % — ABNORMAL LOW (ref 39.0–52.0)
Hemoglobin: 12.3 g/dL — ABNORMAL LOW (ref 13.0–17.0)
MCH: 27 pg (ref 26.0–34.0)
MCHC: 33.2 g/dL (ref 30.0–36.0)
MCV: 81.5 fL (ref 78.0–100.0)
PLATELETS: 142 10*3/uL — AB (ref 150–400)
RBC: 4.55 MIL/uL (ref 4.22–5.81)
RDW: 15 % (ref 11.5–15.5)
WBC: 8.3 10*3/uL (ref 4.0–10.5)

## 2017-04-13 LAB — HIV ANTIBODY (ROUTINE TESTING W REFLEX): HIV Screen 4th Generation wRfx: NONREACTIVE

## 2017-04-13 LAB — TSH: TSH: 0.871 u[IU]/mL (ref 0.350–4.500)

## 2017-04-13 LAB — SEDIMENTATION RATE: Sed Rate: 10 mm/hr (ref 0–16)

## 2017-04-13 LAB — C-REACTIVE PROTEIN: CRP: 2.6 mg/dL — ABNORMAL HIGH (ref <1.0)

## 2017-04-13 LAB — I-STAT CG4 LACTIC ACID, ED: LACTIC ACID, VENOUS: 1.37 mmol/L (ref 0.5–1.9)

## 2017-04-13 MED ORDER — VANCOMYCIN HCL IN DEXTROSE 1-5 GM/200ML-% IV SOLN
1000.0000 mg | Freq: Three times a day (TID) | INTRAVENOUS | Status: DC
  Administered 2017-04-13 (×2): 1000 mg via INTRAVENOUS
  Filled 2017-04-13 (×2): qty 200

## 2017-04-13 MED ORDER — ENOXAPARIN SODIUM 40 MG/0.4ML ~~LOC~~ SOLN
40.0000 mg | Freq: Every day | SUBCUTANEOUS | Status: DC
  Administered 2017-04-14: 40 mg via SUBCUTANEOUS
  Filled 2017-04-13 (×2): qty 0.4

## 2017-04-13 MED ORDER — SODIUM CHLORIDE 0.9 % IV BOLUS (SEPSIS)
1000.0000 mL | Freq: Once | INTRAVENOUS | Status: AC
  Administered 2017-04-13: 1000 mL via INTRAVENOUS

## 2017-04-13 MED ORDER — POTASSIUM CHLORIDE CRYS ER 20 MEQ PO TBCR
20.0000 meq | EXTENDED_RELEASE_TABLET | ORAL | Status: AC
  Administered 2017-04-13: 20 meq via ORAL
  Filled 2017-04-13: qty 1

## 2017-04-13 MED ORDER — FLUCONAZOLE IN SODIUM CHLORIDE 400-0.9 MG/200ML-% IV SOLN
400.0000 mg | Freq: Every day | INTRAVENOUS | Status: DC
  Administered 2017-04-13 – 2017-04-14 (×3): 400 mg via INTRAVENOUS
  Filled 2017-04-13 (×3): qty 200

## 2017-04-13 MED ORDER — NYSTATIN 100000 UNIT/GM EX POWD
Freq: Three times a day (TID) | CUTANEOUS | Status: DC
  Administered 2017-04-13 – 2017-04-15 (×7): via TOPICAL
  Filled 2017-04-13 (×2): qty 15

## 2017-04-13 MED ORDER — ONDANSETRON HCL 4 MG PO TABS: 4.0000 mg | ORAL_TABLET | Freq: Four times a day (QID) | ORAL | Status: DC | PRN

## 2017-04-13 MED ORDER — ALBUTEROL SULFATE (2.5 MG/3ML) 0.083% IN NEBU: 2.5000 mg | INHALATION_SOLUTION | Freq: Four times a day (QID) | RESPIRATORY_TRACT | Status: DC | PRN

## 2017-04-13 MED ORDER — ONDANSETRON HCL 4 MG/2ML IJ SOLN: 4.0000 mg | Freq: Four times a day (QID) | INTRAMUSCULAR | Status: DC | PRN

## 2017-04-13 MED ORDER — ACETAMINOPHEN 325 MG PO TABS: 650.0000 mg | ORAL_TABLET | Freq: Four times a day (QID) | ORAL | Status: DC | PRN

## 2017-04-13 MED ORDER — LINEZOLID 600 MG PO TABS
600.0000 mg | ORAL_TABLET | Freq: Two times a day (BID) | ORAL | Status: DC
  Administered 2017-04-13 – 2017-04-15 (×4): 600 mg via ORAL
  Filled 2017-04-13 (×4): qty 1

## 2017-04-13 MED ORDER — ACETAMINOPHEN 650 MG RE SUPP: 650.0000 mg | Freq: Four times a day (QID) | RECTAL | Status: DC | PRN

## 2017-04-13 MED ORDER — NICOTINE 21 MG/24HR TD PT24
21.0000 mg | MEDICATED_PATCH | Freq: Every day | TRANSDERMAL | Status: DC
  Administered 2017-04-13 – 2017-04-15 (×3): 21 mg via TRANSDERMAL
  Filled 2017-04-13 (×4): qty 1

## 2017-04-13 MED ORDER — LINEZOLID 600 MG PO TABS
600.0000 mg | ORAL_TABLET | Freq: Two times a day (BID) | ORAL | Status: DC
  Filled 2017-04-13: qty 1

## 2017-04-13 MED ORDER — SODIUM CHLORIDE 0.9 % IV SOLN
INTRAVENOUS | Status: DC
  Administered 2017-04-13 – 2017-04-14 (×3): via INTRAVENOUS

## 2017-04-13 NOTE — Progress Notes (Signed)
[facility]                                                                              Patient Demographics  John Velasquez, is a 52 y.o. male, DOB - [birth year], ZOX:[id number]  Admit date - 04/12/2017   Admitting Physician John Braun, MD  Outpatient Primary MD for the patient is Patient, No Pcp Per  Outpatient specialists:   LOS - 0  days   Medical records reviewed and are as summarized below:    Chief Complaint  Patient presents with  . Recurrent Skin Infections  . Wound Check       Brief summary   Patient is a 52 year old male with hypertension, presented with worsening skin infection.  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  Per patient as a result his feet sweat a lot and he developed a rash on them approximately 2 months ago.  No improvement with over-the-counter creams and sprays.  Patient saw podiatry 2 weeks ago and was started on 10-day course of Augmentin and Lamisil with no significant improvement.  In the last 1 week patient noticed purulent foul-smelling drainage coming from his feet, also similar rash on his navel, fingers, groin.   Assessment & Plan    Principal Problem:   Cellulitis superimposed on tinea infection -Failed outpatient treatment with Augmentin and Lamisil -HIV negative, CRP 2.6 -Patient was started on IV vancomycin and fluconazole on admission.  ID consulted, recommended terbinafine 250 mg daily with linezolid. -Discussed with dermatology, Dr. Dareen Velasquez at [facility], recommended continue current management and arranged outpatient appointment with Dr John Velasquez on 3/19 at 10:00AM.     Active Problems:    Lactic acidosis -Lactic acid 2.06 at the time of admission, improved with IV fluid hydration. No SIRS or sepsis.     Hypokalemia -Patient received potassium replacement    Tobacco abuse -Counseled on smoking cessation     Hyperglycemia -Hemoglobin A1c mildly  elevated at 5.7, prediabetic range, recommend exercise and carb modified diet  Code Status: Full CODE STATUS DVT Prophylaxis:  Lovenox Family Communication: Discussed in detail with the patient, all imaging results, lab results explained to the patient and mother at the bedside   Disposition Plan: Hopefully DC home tomorrow if improving  Time Spent in minutes   35 minutes  Procedures:  None  Consultants:   Infectious disease Dermatology on-call at [facility], Dr. Dareen Velasquez  Antimicrobials:   Linezolid 3/15   Medications  Scheduled Meds: . enoxaparin (LOVENOX) injection  40 mg Subcutaneous Daily  . linezolid  600 mg Oral Q12H  . nicotine  21 mg Transdermal Daily  . nystatin   Topical TID   Continuous Infusions: . sodium chloride 75 mL/hr at 04/13/17 0405  . fluconazole (DIFLUCAN) IV Stopped (04/13/17 0545)   PRN Meds:.acetaminophen **OR** acetaminophen, albuterol, ondansetron **OR** ondansetron (ZOFRAN) IV   Antibiotics   Anti-infectives (From admission, onward)   Start     Dose/Rate Route Frequency Ordered Stop   04/13/17 1800  linezolid (ZYVOX) tablet 600 mg     600 mg Oral Every 12 hours 04/13/17 1103  04/17/17 0959   04/13/17 1100  linezolid (ZYVOX) tablet 600 mg  Status:  Discontinued     600 mg Oral Every 12 hours 04/13/17 1059 04/13/17 1103   04/13/17 0315  fluconazole (DIFLUCAN) IVPB 400 mg     400 mg 100 mL/hr over 120 Minutes Intravenous Daily at bedtime 04/13/17 0257     04/13/17 0100  vancomycin (VANCOCIN) IVPB 1000 mg/200 mL premix  Status:  Discontinued     1,000 mg 200 mL/hr over 60 Minutes Intravenous Every 8 hours 04/13/17 0054 04/13/17 1059        Subjective:   John Velasquez was seen and examined today.  Patient denies dizziness, chest pain, shortness of breath, abdominal pain, N/V/D/C.  Feels her feet are less swollen, still having purulent discharge from the webs of the feet having rash on the left hand, navel, back, arms  Objective:    Vitals:   04/13/17 1000 04/13/17 1100 04/13/17 1130 04/13/17 1200  BP: (!) 135/104 (!) 158/107 (!) 144/100 (!) 147/115  Pulse: 89 95 99 84  Resp:      Temp:      SpO2: 97% 99% 98% 98%  Weight:      Height:        Intake/Output Summary (Last 24 hours) at 04/13/2017 1256 Last data filed at 04/13/2017 1051 Gross per 24 hour  Intake 1400 ml  Output -  Net 1400 ml     Wt Readings from Last 3 Encounters:  04/12/17 95.3 kg (210 lb)  03/17/16 106.4 kg (234 lb 8 oz)  03/10/16 105.7 kg (233 lb)     Exam  General: Alert and oriented x 3, NAD  Eyes:   HEENT:  Atraumatic, normocephalic  Cardiovascular: S1 S2 auscultated, no rubs, murmurs or gallops. Regular rate and rhythm.  Respiratory: Clear to auscultation bilaterally, no wheezing, rales or rhonchi  Gastrointestinal: Soft, nontender, nondistended, + bowel sounds  Ext: no pedal edema bilaterally  Neuro: AAOx3, Cr N's II- XII. Strength 5/5 upper and lower extremities bilaterally, speech clear, sensations grossly intact  Musculoskeletal: No digital cyanosis, clubbing  Skin:  rash on bilateral feet with a yellowish purulent drainage, foul-smelling, also on the hands left worse than right, navel area, groin  Psych: Normal affect and demeanor, alert and oriented x3    Data Reviewed:  I have personally reviewed following labs and imaging studies  Micro Results No results found for this or any previous visit (from the past 240 hour(s)).  Radiology Reports Dg Chest Port 1 View  Result Date: 04/13/2017 CLINICAL DATA:  Acute onset of tachycardia. EXAM: PORTABLE CHEST 1 VIEW COMPARISON:  None. FINDINGS: The lungs are well-aerated and clear. There is no evidence of focal opacification, pleural effusion or pneumothorax. The cardiomediastinal silhouette is borderline normal in size. No acute osseous abnormalities are seen. IMPRESSION: No acute cardiopulmonary process seen. Electronically Signed   By: Roanna Raider M.D.   On:  04/13/2017 04:52    Lab Data:  CBC: Recent Labs  Lab 04/12/17 1540 04/13/17 0406  WBC 9.8 8.3  NEUTROABS 7.7  --   HGB 14.3 12.3*  HCT 42.6 37.1*  MCV 81.0 81.5  PLT 144* 142*   Basic Metabolic Panel: Recent Labs  Lab 04/12/17 1540  NA 137  K 3.4*  CL 106  CO2 21*  GLUCOSE 165*  BUN 8  CREATININE 0.91  CALCIUM 8.8*   GFR: Estimated Creatinine Clearance: 115.1 mL/min (by C-G formula based on SCr of 0.91 mg/dL). Liver Function Tests:  Recent Labs  Lab 04/12/17 1540  AST 24  ALT 12*  ALKPHOS 58  BILITOT 1.1  PROT 6.8  ALBUMIN 3.7   No results for input(s): LIPASE, AMYLASE in the last 168 hours. No results for input(s): AMMONIA in the last 168 hours. Coagulation Profile: No results for input(s): INR, PROTIME in the last 168 hours. Cardiac Enzymes: No results for input(s): CKTOTAL, CKMB, CKMBINDEX, TROPONINI in the last 168 hours. BNP (last 3 results) No results for input(s): PROBNP in the last 8760 hours. HbA1C: Recent Labs    04/13/17 0406  HGBA1C 5.7*   CBG: No results for input(s): GLUCAP in the last 168 hours. Lipid Profile: No results for input(s): CHOL, HDL, LDLCALC, TRIG, CHOLHDL, LDLDIRECT in the last 72 hours. Thyroid Function Tests: Recent Labs    04/13/17 0406  TSH 0.871   Anemia Panel: No results for input(s): VITAMINB12, FOLATE, FERRITIN, TIBC, IRON, RETICCTPCT in the last 72 hours. Urine analysis: No results found for: COLORURINE, APPEARANCEUR, LABSPEC, PHURINE, GLUCOSEU, HGBUR, BILIRUBINUR, KETONESUR, PROTEINUR, UROBILINOGEN, NITRITE, LEUKOCYTESUR   Ripudeep Rai M.D. [facility] 04/13/2017, 12:56 PM  Pager: [phone] Between 7am to 7pm - call Pager - [phone]  After 7pm go to [email or web address] - password [email or web address]  Call night coverage person covering after 7pm

## 2017-04-13 NOTE — Consult Note (Signed)
[facility]    Date of Admission:  04/12/2017     Total days of antibiotics                Reason for Consult: Cellulitis    Referring Provider: Rai Primary Care Provider: Patient, No Pcp Per   Assessment/Plan:  Rash/Tinea - Likely fungal infection given mild improvement with Lamisil and clinical presentation that may be superimposed with a skin infection. Will discontinue vancomycin and start 4 day course of linezolid. Please provide at discharge remaining doses needed.Fluconzole while inpatient. Continue with previous Lamisil at discharge (Terbinafine 250 mg daily). Per primary team a dermatologist appointment has been set in about 1 week.   Principal Problem:   Cellulitis Active Problems:   Tinea   Lactic acidosis   Hypokalemia   Tobacco abuse   Hyperglycemia   . enoxaparin (LOVENOX) injection  40 mg Subcutaneous Daily  . nicotine  21 mg Transdermal Daily  . nystatin   Topical TID     HPI: John Velasquez is a 52 y.o. male presenting to the ED on 3/156 with recurrent skin infections and wound check.  Recently evaluated by his podiatrist with previous history of bilateral tinea pedis.  Advised to seek inpatient care having increased pain, redness, drainage and swelling over both of his feet for several weeks.  Rash has spread to his navel, groin, and bilateral forearm and hands.  Denies fevers.  He recently completed a 10-day course of Augmentin prescribed by podiatry.  Has been taking Lamisil for the last 3 months.  Cultures obtained with concern for systemic Candida infection.  He was started on vancomycin.  Evaluated by WOC RN with unclear wound etiology and recommendation for dermatology and ID consult.   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 Around Christmas he noted he was developing a rash around his  feet. When he started taking the Lamisil he noted there was improvement in his symptoms which he indicates today is still a little better than initially. He has had foot swelling recently for which he completed Augmentin prescribed by his podiatrist.    Review of Systems: Review of Systems  Constitutional: Negative for chills and fever.  Respiratory: Negative for cough, sputum production, shortness of breath and wheezing.   Cardiovascular: Negative for chest pain and leg swelling.  Gastrointestinal: Negative for abdominal pain, diarrhea, nausea and vomiting.  Skin: Positive for itching and rash.  Neurological: Negative for weakness.     Past Medical History:  Diagnosis Date  . Hypertension     Social History   Tobacco Use  . Smoking status: Former Smoker    Packs/day: 1.00    Years: 35.00    Pack years: 35.00    Types: Cigarettes    Last attempt to quit: 02/08/2016    Years since quitting: 1.1  . Smokeless tobacco: Never Used  Substance Use Topics  . Alcohol use: No  . Drug use: No    Family History  Problem Relation Age of Onset  . Cancer Maternal Grandmother        unknown  . Cancer Paternal Grandmother        unknown  . Heart disease Mother   . Hypertension Mother   . Cancer Father        throat  . Alcohol abuse  Father     No Known Allergies  OBJECTIVE: Blood pressure (!) 137/95, pulse 91, temperature 98.1 F (36.7 C), resp. rate (!) 24, height 6' (1.829 m), weight 210 lb (95.3 kg), SpO2 97 %.  Physical Exam  Constitutional: He is well-developed, well-nourished, and in no distress. No distress.  Cardiovascular: Normal rate, regular rhythm, normal heart sounds and intact distal pulses. Exam reveals no gallop and no friction rub.  No murmur heard. Pulmonary/Chest: Effort normal and breath sounds normal. No respiratory distress. He has no wheezes. He has no rales. He exhibits no tenderness.  Skin:  Rash located on bilateral feet, hands (left worse than right)  and navel. Areas of brown crust with moist white maceration between toes bilaterally and fingers on the left hand.            Right Foot`   Left foot      Left foot                                     Navel   Lab Results Lab Results  Component Value Date   WBC 8.3 04/13/2017   HGB 12.3 (L) 04/13/2017   HCT 37.1 (L) 04/13/2017   MCV 81.5 04/13/2017   PLT 142 (L) 04/13/2017    Lab Results  Component Value Date   CREATININE 0.91 04/12/2017   BUN 8 04/12/2017   NA 137 04/12/2017   K 3.4 (L) 04/12/2017   CL 106 04/12/2017   CO2 21 (L) 04/12/2017    Lab Results  Component Value Date   ALT 12 (L) 04/12/2017   AST 24 04/12/2017   ALKPHOS 58 04/12/2017   BILITOT 1.1 04/12/2017     Microbiology: No results found for this or any previous visit (from the past 240 hour(s)).   Marcos Eke, NP [facility] [phone] Pager  04/13/2017  9:52 AM

## 2017-04-13 NOTE — ED Provider Notes (Addendum)
[facility] EMERGENCY DEPARTMENT Provider Note   CSN: [id number] Arrival date & time: 04/12/17  1510     History   Chief Complaint Chief Complaint  Patient presents with  . Recurrent Skin Infections  . Wound Check    HPI John Velasquez is a 52 y.o. male.  Patient reports worsening bilateral foot pain for several weeks.  He has a history of athlete's foot and was seen by podiatry today and told to come to the hospital.  States he has had increased pain, redness, drainage and swelling over both of his feet for several weeks.  Is also had a rash that spreads to his navel, groin and bilateral forearms and hands.  Denies fever, chills, nausea or vomiting.  He denies any chest pain or shortness of breath.  Denies a history of diabetes.  He recently completed a course of Augmentin for 10 days prescribed by podiatry.  He is also on p.o. Lamisil which she is taking for 3 months.  He states he does not feel ill.  He has been eating and drinking well.  No diarrhea.  No pain with urination or blood in the urine.   The history is provided by the patient.  Wound Check  Pertinent negatives include no chest pain, no abdominal pain, no headaches and no shortness of breath.    Past Medical History:  Diagnosis Date  . Hypertension     Patient Active Problem List   Diagnosis Date Noted  . HTN (hypertension), benign 03/10/2016  . Leukonychia 03/10/2016  . 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    History reviewed. No pertinent surgical history.     Home Medications    Prior to Admission medications   Medication Sig Start Date End Date Taking? Authorizing Provider  acetaminophen (TYLENOL) 500 MG tablet Take 1 tablet (500 mg total) by mouth every 6 (six) hours as needed. 01/02/14   Cartner, Sharlet Salina, PA-C  amLODipine (NORVASC) 5 MG tablet Take 1 tablet (5 mg total) by mouth daily. 03/10/16   Nche, Bonna Gains, NP  amLODipine (NORVASC) 5 MG tablet Take 1 tablet (5 mg total) by mouth  daily. 03/21/17   Cathren Laine, MD  B Complex-C (B-COMPLEX WITH VITAMIN C) tablet Take 1 tablet by mouth daily. 03/10/16   Nche, Bonna Gains, NP  clotrimazole (LOTRIMIN) 1 % cream Apply to affected area 2 times daily 03/21/17   Cathren Laine, MD  fenofibrate (TRICOR) 145 MG tablet Take 1 tablet (145 mg total) by mouth daily. 03/18/16   Nche, Bonna Gains, NP  hydrochlorothiazide (HYDRODIURIL) 12.5 MG tablet Take 1 tablet (12.5 mg total) by mouth daily. 03/10/16   Nche, Bonna Gains, NP  multivitamin (ONE-A-DAY MEN'S) TABS tablet Take 1 tablet by mouth daily. 03/10/16   Nche, Bonna Gains, NP  omega-3 acid ethyl esters (LOVAZA) 1 g capsule Take 1 capsule (1 g total) by mouth 2 (two) times daily. 03/18/16   Nche, Bonna Gains, NP    Family History Family History  Problem Relation Age of Onset  . Cancer Maternal Grandmother        unknown  . Cancer Paternal Grandmother        unknown  . Heart disease Mother   . Hypertension Mother   . Cancer Father        throat  . Alcohol abuse Father     Social History Social History   Tobacco Use  . Smoking status: Former Smoker    Packs/day: 1.00    Years: 35.00  Pack years: 35.00    Types: Cigarettes    Last attempt to quit: 02/08/2016    Years since quitting: 1.1  . Smokeless tobacco: Never Used  Substance Use Topics  . Alcohol use: No  . Drug use: No     Allergies   Patient has no known allergies.   Review of Systems Review of Systems  Constitutional: Negative for activity change, appetite change and fever.  HENT: Negative for congestion and rhinorrhea.   Respiratory: Negative for cough, chest tightness and shortness of breath.   Cardiovascular: Negative for chest pain.  Gastrointestinal: Negative for abdominal pain, nausea and vomiting.  Genitourinary: Negative for dysuria, scrotal swelling and testicular pain.  Musculoskeletal: Negative for arthralgias and myalgias.  Skin: Positive for rash and wound.  Neurological: Negative  for dizziness, weakness and headaches.  Psychiatric/Behavioral: Negative for agitation.    all other systems are negative except as noted in the HPI and PMH.    Physical Exam Updated Vital Signs BP (!) 125/102 (BP Location: Left Arm)   Pulse 100   Temp 98.1 F (36.7 C)   Resp 18   Ht 6' (1.829 m)   Wt 95.3 kg (210 lb)   SpO2 97%   BMI 28.48 kg/m   Physical Exam  Constitutional: He is oriented to person, place, and time. He appears well-developed and well-nourished. No distress.  HENT:  Head: Normocephalic and atraumatic.  Mouth/Throat: Oropharynx is clear and moist. No oropharyngeal exudate.  Eyes: Conjunctivae and EOM are normal. Pupils are equal, round, and reactive to light.  Neck: Normal range of motion. Neck supple.  No meningismus.  Cardiovascular: Normal rate, regular rhythm, normal heart sounds and intact distal pulses.  No murmur heard. Pulmonary/Chest: Effort normal and breath sounds normal. No respiratory distress.  Abdominal: Soft. There is no tenderness. There is no rebound and no guarding.  Patient with skin breakdown and erythematous rash to navel with clear drainage and maceration.  Musculoskeletal: Normal range of motion. He exhibits edema and tenderness.  Patient with edema and swelling to bilateral feet.  There is diffuse maceration between the toes with foul discharge and clear yellow drainage.  There is hyperkeratotic peeling skin on the medial heel bilaterally with some skin breakdown medially.  There is increased swelling of bilateral forefeet with increased erythema and drainage over the web spaces.  Diffusely thickened toenails.  Intact DP pulses.  Pruritic papules to bilateral hands and upper extremities  Neurological: He is alert and oriented to person, place, and time. No cranial nerve deficit. He exhibits normal muscle tone. Coordination normal.  No ataxia on finger to nose bilaterally. No pronator drift. 5/5 strength throughout. CN 2-12 intact.Equal  grip strength. Sensation intact.   Skin: Skin is warm. Rash noted. There is erythema.  Psychiatric: He has a normal mood and affect. His behavior is normal.  Nursing note and vitals reviewed.    ED Treatments / Results  Labs (all labs ordered are listed, but only abnormal results are displayed) Labs Reviewed  CBC WITH DIFFERENTIAL/PLATELET - Abnormal; Notable for the following components:      Result Value   Platelets 144 (*)    All other components within normal limits  COMPREHENSIVE METABOLIC PANEL - Abnormal; Notable for the following components:   Potassium 3.4 (*)    CO2 21 (*)    Glucose, Bld 165 (*)    Calcium 8.8 (*)    ALT 12 (*)    All other components within normal limits  HEMOGLOBIN  A1C - Abnormal; Notable for the following components:   Hgb A1c MFr Bld 5.7 (*)    All other components within normal limits  CBC - Abnormal; Notable for the following components:   Hemoglobin 12.3 (*)    HCT 37.1 (*)    Platelets 142 (*)    All other components within normal limits  C-REACTIVE PROTEIN - Abnormal; Notable for the following components:   CRP 2.6 (*)    All other components within normal limits  I-STAT CG4 LACTIC ACID, ED - Abnormal; Notable for the following components:   Lactic Acid, Venous 2.06 (*)    All other components within normal limits  CULTURE, BLOOD (ROUTINE X 2)  CULTURE, BLOOD (ROUTINE X 2)  TSH  HIV ANTIBODY (ROUTINE TESTING)  SEDIMENTATION RATE  I-STAT CG4 LACTIC ACID, ED  I-STAT CG4 LACTIC ACID, ED    EKG  EKG Interpretation None       Radiology No results found.  Procedures Procedures (including critical care time)  Medications Ordered in ED Medications  sodium chloride 0.9 % bolus 1,000 mL (not administered)     Initial Impression / Assessment and Plan / ED Course  I have reviewed the triage vital signs and the nursing notes.  Pertinent labs & imaging results that were available during my care of the patient were reviewed by  me and considered in my medical decision making (see chart for details).    Patient with progressively worsening bilateral feet infection.  Sent by podiatry for IV antibiotics.  He recently completed a 10-day course of Augmentin and is currently on p.o. Lamisil.  Does not appear septic.  IV antibiotics given after cultures obtained.  Concern for systemic candida infection as well. May need ID consult in morning.   Dw Dr Katrinka Blazing.   Final Clinical Impressions(s) / ED Diagnoses   Final diagnoses:  Cellulitis of foot  Tinea pedis of both feet    ED Discharge Orders    None       Ronal Maybury, Jeannett Senior, MD 04/13/17 1610    Glynn Octave, MD 04/13/17 [phone]

## 2017-04-13 NOTE — ED Notes (Signed)
Pt given graham crackers and apple juice  

## 2017-04-13 NOTE — Consult Note (Signed)
WOC Nurse wound consult note Reason for Consult: Tinea pedis of the feet Wound type: Unclear etiology Pressure Injury POA: Yes Exam findings as follows: Edema to bilateral feet, left greater than right.  Scattered open areas interspersed with large areas of dried, crusted areas.  There is also a good amount of yellow drainage from the feet and between toes of both feet.  ADDITIONALLY, he has a pool of yellow drainage in the umbilicus and it is surrounded by darkened, dried, crusty areas.  His left hand has some of the same presentation, his forearms, and it appears it may be starting on his back.    Recommendations:  1. Get a dermatology consult asap. 2. Get infectious disease involved asap. 3. For now, consider placing him on Contact Isolation since the organism(s) or processes responsible for the presentation have not been identified. 4. If fungus is suspected to be the primary culprit, provide systemic anti-fungals. 5. Consider ordering nystatin powder to the areas on the feet, between the toes, and hands. 6. Open areas can be covered with a non-adhering dressing such as telfa.  These would need to be changed daily and prn soilage.  Please be advised, this patient's overall condition exceeds the scope of the WOC nurse.  Thank you for the consult.  Discussed plan of care with the patient and bedside nurse.  WOC nurse will not follow at this time.  Please re-consult the WOC team if needed.  Helmut Muster, RN, MSN, CWOCN, CNS-BC, pager [phone]

## 2017-04-13 NOTE — Progress Notes (Signed)
Pharmacy Antibiotic Note  John Velasquez is a 52 y.o. male admitted on 04/12/2017 with cellulitis.  Pharmacy has been consulted for Vancomycin dosing. Pt with lower extremity cellulitis. WBC WNL. Renal function WNL. Lactic acid is elevated. Pt afebrile. No response to Augmentin.   Plan: Vancomycin 1000 mg IV q8h Trend WBC, temp, renal function  F/U infectious work-up Drug levels as indicated   Height: 6' (182.9 cm) Weight: 210 lb (95.3 kg) IBW/kg (Calculated) : 77.6  Temp (24hrs), Avg:98.1 F (36.7 C), Min:98.1 F (36.7 C), Max:98.1 F (36.7 C)  Recent Labs  Lab 04/12/17 1540 04/12/17 1556  WBC 9.8  --   CREATININE 0.91  --   LATICACIDVEN  --  2.06*    Estimated Creatinine Clearance: 115.1 mL/min (by C-G formula based on SCr of 0.91 mg/dL).     John Velasquez, John Velasquez 04/13/2017 12:55 AM

## 2017-04-13 NOTE — ED Notes (Signed)
Meal tray ordered 

## 2017-04-13 NOTE — H&P (Signed)
History and Physical    John Velasquez CWU:[id number] DOB: 1965/07/30 DOA: 04/12/2017  Referring MD/NP/PA: Dr. Wyvonnia Dusky PCP: Patient, No Pcp Per  Patient coming from: home  Chief Complaint: Skin infection  I have personally briefly reviewed patient's old medical records in [place]   HPI: John Velasquez is a 52 y.o. male with medical history significant of HTN; who presents with complaints of worsening skin infection.  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  As result he notes that his feet sweat a lot and he developed a rash on them approximately 2 months ago.  Tried utilizing over-the-counter creams and sprays without relief.  Notes having associated symptoms of itchiness, swelling, redness, and pain in his bilateral feet.  During this time he reports that the rash had progressively spread to his groin, navel, bilateral hands, and forearms.   He had gone to see Dr. Yaakov Guthrie podiatry approximately 2 weeks ago and was started on a 10-day course of Augmentin and Lamisil.  Patient reports completing course without relief of symptoms.  He reports noticing purulent drainage with foul smell coming from his feet.  He still continue to work and reports having to change out his socks multiple times during the day due to the drainage.  Denies having any chest pain, weight loss, nausea, vomiting, abdominal pain, dysuria, urinary frequency, history of diabetes, or known malignancy.  He admits to restarting smoking a pack of cigarettes per day on average after previously quitting for several months.  When he followed back up with the podiatrist office today due to worsening of symptoms he was sent to the emergency department for further evaluation.  ED Course: Upon admission to the emergency department patient was noted to be afebrile, pulse 86 115, respirations within normal limits, blood pressure 125/102-163/109, and O2  saturation maintained on room air.  Labs revealed WBC 9.8, platelets 144, potassium 3.4, glucose 165, and initial lactic acid 2.06.  Patient was started on empiric antibiotics of vancomycin and given 1 L of normal saline IV fluids with resolution of elevated lactic acid on repeat check.  Review of Systems  Constitutional: Positive for diaphoresis (Reports waking up 2 nights drenched in sweat). Negative for chills and fever.  HENT: Negative for congestion and nosebleeds.   Eyes: Negative for photophobia and pain.  Respiratory: Negative for cough and hemoptysis.   Cardiovascular: Positive for leg swelling. Negative for palpitations and claudication.  Gastrointestinal: Negative for abdominal pain, diarrhea, nausea and vomiting.  Genitourinary: Negative for dysuria and hematuria.  Musculoskeletal: Negative for joint pain and myalgias.  Skin: Positive for itching and rash.  Neurological: Negative for tingling and loss of consciousness.  Psychiatric/Behavioral: Negative for suicidal ideas. The patient is not nervous/anxious.     Past Medical History:  Diagnosis Date  . Hypertension     History reviewed. No pertinent surgical history.   reports that he quit smoking about 14 months ago. His smoking use included cigarettes. He has a 35.00 pack-year smoking history. he has never used smokeless tobacco. He reports that he does not drink alcohol or use drugs.  No Known Allergies  Family History  Problem Relation Age of Onset  . Cancer Maternal Grandmother        unknown  . Cancer Paternal Grandmother        unknown  . Heart disease Mother   . Hypertension Mother   . Cancer Father  throat  . Alcohol abuse Father     Prior to Admission medications   Medication Sig Start Date End Date Taking? Authorizing Provider  acetaminophen (TYLENOL) 500 MG tablet Take 1 tablet (500 mg total) by mouth every 6 (six) hours as needed. Patient taking differently: Take 500 mg by mouth every 6 (six)  hours as needed for mild pain.  01/02/14  Yes Cartner, Marland Kitchen, PA-C  terbinafine (LAMISIL) 250 MG tablet Take 250 mg by mouth daily.   Yes [provider]    Physical Exam:  Constitutional: NAD, calm, comfortable Vitals:   04/12/17 1519 04/12/17 1703 04/13/17 0157  BP: (!) 163/109 (!) 125/102 (!) 127/93  Pulse: (!) 115 100 86  Resp: 19 18 16   Temp: 98.1 F (36.7 C)  98.1 F (36.7 C)  SpO2: 98% 97% 100%  Weight: 95.3 kg (210 lb)    Height: 6' (1.829 m)     Eyes: PERRL, lids and conjunctivae normal ENMT: Mucous membranes are moist. Posterior pharynx clear of any exudate or lesions.Normal dentition.  Neck: normal, supple, no masses, no thyromegaly Respiratory: clear to auscultation bilaterally, no wheezing, no crackles. Normal respiratory effort. No accessory muscle use.  Cardiovascular: Regular rate and rhythm, no murmurs / rubs / gallops. No extremity edema. 2+ pedal pulses. No carotid bruits.  Abdomen: no tenderness, no masses palpated. No hepatosplenomegaly. Bowel sounds positive.  Musculoskeletal: Swelling noted of the bilateral feet with some signs of possible clubbing of the bilateral nails of the hands.  Patient has decreased range of motion of the toes due to pain. Skin: Erythematous rash present of the bilateral feet with skin breakdown in between toes with foul yellow discharge.  Patient has discoloration present of the nailbeds of the upper and lower extremities.  The rash also present in in the patient's navel with similar yellow drainage. Pruritic appearing rash topicals present of the bilateral hands and groin. Neurologic: CN 2-12 grossly intact. Sensation intact, DTR normal. Strength 5/5 in all 4.  Psychiatric: Normal judgment and insight. Alert and oriented x 3. Normal mood.     Labs on Admission: I have personally reviewed following labs and imaging studies  CBC: Recent Labs  Lab 04/12/17 1540  WBC 9.8  NEUTROABS 7.7  HGB 14.3  HCT 42.6  MCV 81.0    PLT 469*   Basic Metabolic Panel: Recent Labs  Lab 04/12/17 1540  NA 137  K 3.4*  CL 106  CO2 21*  GLUCOSE 165*  BUN 8  CREATININE 0.91  CALCIUM 8.8*   GFR: Estimated Creatinine Clearance: 115.1 mL/min (by C-G formula based on SCr of 0.91 mg/dL). Liver Function Tests: Recent Labs  Lab 04/12/17 1540  AST 24  ALT 12*  ALKPHOS 58  BILITOT 1.1  PROT 6.8  ALBUMIN 3.7   No results for input(s): LIPASE, AMYLASE in the last 168 hours. No results for input(s): AMMONIA in the last 168 hours. Coagulation Profile: No results for input(s): INR, PROTIME in the last 168 hours. Cardiac Enzymes: No results for input(s): CKTOTAL, CKMB, CKMBINDEX, TROPONINI in the last 168 hours. BNP (last 3 results) No results for input(s): PROBNP in the last 8760 hours. HbA1C: No results for input(s): HGBA1C in the last 72 hours. CBG: No results for input(s): GLUCAP in the last 168 hours. Lipid Profile: No results for input(s): CHOL, HDL, LDLCALC, TRIG, CHOLHDL, LDLDIRECT in the last 72 hours. Thyroid Function Tests: No results for input(s): TSH, T4TOTAL, FREET4, T3FREE, THYROIDAB in the last 72 hours. Anemia Panel:  No results for input(s): VITAMINB12, FOLATE, FERRITIN, TIBC, IRON, RETICCTPCT in the last 72 hours. Urine analysis: No results found for: COLORURINE, APPEARANCEUR, LABSPEC, PHURINE, GLUCOSEU, HGBUR, BILIRUBINUR, KETONESUR, PROTEINUR, UROBILINOGEN, NITRITE, LEUKOCYTESUR Sepsis Labs: No results found for this or any previous visit (from the past 240 hour(s)).   Radiological Exams on Admission: No results found.  EKG: Independently reviewed.  Normal sinus rhythm with QTc 480  Assessment/Plan Cellulitis and tinea infection: Acute.  Patient follow outpatient setting by podiatry for tinea infection.  Failed outpatient treatment with Augmentin and Lamisil. - Admit to a MedSurg bed - Follow-up blood cultures - Continue empiric antibiotics of vancomycin and fluconazole IV  - Check  HIV, CRP, ESR - Will need to consult ID in a.m.  Lactic acidosis: Resolved.  Initial lactic acid 2.06, but after 1 L of normal saline IV fluids lactic acid noted to be 1.37.  Hypokalemia: Acute.  Initial potassium 3.4. - Give 20 mEq of potassium chloride x1 dose now - Continue to monitor and replace as needed  Hyperglycemia: Patient noted to have elevated blood glucose of 165 on admission.  Question possibility of diabetes as reason for symptoms as seen above. - Check hemoglobin A1c  Thrombocytopenia: Acute. Initial platelet count noted to be just mildly low at 144.  Liver enzymes appear relatively within normal limits.  Patient reports occasionally drinking.  Tobacco abuse: Patient reports previously quitting tobacco, but restarted smoking a pack of cigarettes per day on average.  - Counseled on the need of cessation of tobacco  - Nicotine patch  Patient in need of a PCP  DVT prophylaxis: Lovenox Code Status: Full Family Communication: No family present at bedside Disposition Plan: To be determined Consults called: None Admission status: Inpatient  Norval Morton MD [facility] Pager [phone]   If 7PM-7AM, please contact night-coverage [email or web address] Password [email or web address]  04/13/2017, 2:15 AM

## 2017-04-14 LAB — BASIC METABOLIC PANEL
Anion gap: 9 (ref 5–15)
BUN: 10 mg/dL (ref 6–20)
CALCIUM: 8.7 mg/dL — AB (ref 8.9–10.3)
CO2: 24 mmol/L (ref 22–32)
Chloride: 107 mmol/L (ref 101–111)
Creatinine, Ser: 0.9 mg/dL (ref 0.61–1.24)
GFR calc Af Amer: >60 mL/min (ref >60)
Glucose, Bld: 114 mg/dL — ABNORMAL HIGH (ref 65–99)
POTASSIUM: 3.7 mmol/L (ref 3.5–5.1)
SODIUM: 140 mmol/L (ref 135–145)

## 2017-04-14 MED ORDER — HYDRALAZINE HCL 20 MG/ML IJ SOLN
10.0000 mg | Freq: Four times a day (QID) | INTRAMUSCULAR | Status: DC | PRN
Start: 2017-04-14 — End: 2017-04-15
  Administered 2017-04-15: 10 mg via INTRAVENOUS
  Filled 2017-04-14: qty 1

## 2017-04-14 MED ORDER — HYDRALAZINE HCL 20 MG/ML IJ SOLN: 10.0000 mg | Freq: Four times a day (QID) | INTRAMUSCULAR | Status: DC | PRN

## 2017-04-14 MED ORDER — DIPHENHYDRAMINE HCL 25 MG PO CAPS
25.0000 mg | ORAL_CAPSULE | Freq: Four times a day (QID) | ORAL | Status: AC | PRN
  Administered 2017-04-14 (×2): 25 mg via ORAL
  Filled 2017-04-14 (×2): qty 1

## 2017-04-14 MED ORDER — HYDROCHLOROTHIAZIDE 12.5 MG PO CAPS
12.5000 mg | ORAL_CAPSULE | Freq: Every day | ORAL | Status: DC
  Administered 2017-04-14 – 2017-04-15 (×2): 12.5 mg via ORAL
  Filled 2017-04-14 (×2): qty 1

## 2017-04-14 MED ORDER — LOSARTAN POTASSIUM 50 MG PO TABS
50.0000 mg | ORAL_TABLET | Freq: Every day | ORAL | Status: DC
  Administered 2017-04-14: 50 mg via ORAL
  Filled 2017-04-14: qty 1

## 2017-04-14 MED ORDER — DIPHENHYDRAMINE HCL 50 MG/ML IJ SOLN
25.0000 mg | Freq: Once | INTRAMUSCULAR | Status: AC
  Administered 2017-04-15: 25 mg via INTRAVENOUS
  Filled 2017-04-14: qty 1

## 2017-04-14 NOTE — Progress Notes (Addendum)
[facility]                                                                              Patient Demographics  John Velasquez, is a 52 y.o. male, DOB - December 30, 1965, ZOX:[id number]  Admit date - 04/12/2017   Admitting Physician Clydie Braun, MD  Outpatient Primary MD for the patient is Patient, No Pcp Per  Outpatient specialists:   LOS - 1  days   Medical records reviewed and are as summarized below:    Chief Complaint  Patient presents with  . Recurrent Skin Infections  . Wound Check       Brief summary   Patient is a 52 year old male with hypertension, presented with worsening skin infection.  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  Per patient as a result his feet sweat a lot and he developed a rash on them approximately 2 months ago.  No improvement with over-the-counter creams and sprays.  Patient saw podiatry 2 weeks ago and was started on 10-day course of Augmentin and Lamisil with no significant improvement.  In the last 1 week patient noticed purulent foul-smelling drainage coming from his feet, also similar rash on his navel, fingers, groin.   Assessment & Plan    Principal Problem:   Cellulitis superimposed on tinea infection -Failed outpatient treatment with Augmentin and Lamisil -HIV negative, CRP 2.6 -Patient was started on IV vancomycin and fluconazole on admission.  ID consulted, recommended terbinafine 250 mg daily with linezolid. -Discussed with dermatology, Dr. Dareen Piano at [facility], recommended continue current management and arranged outpatient appointment with Dr Marylou Flesher on 3/19 at 10:00AM.   -Continue current management, provided education about feet hygiene with tinea infection -Saline lock, Doppler ultrasound of the lower extremities to rule out DVT   Active Problems:  Uncontrolled hypertension -BP elevated, started on hydrochlorothiazide 12.5 mg daily with losartan 50 mg  daily -Strongly recommended to follow-up with PCP, patient has an appointment on 5/24 with [facility] physicians    Lactic acidosis -Lactic acid 2.06 at the time of admission, improved with IV fluid hydration. No SIRS or sepsis.     Hypokalemia -Patient received potassium replacement    Tobacco abuse -Counseled on smoking cessation     Hyperglycemia -Hemoglobin A1c mildly elevated at 5.7, prediabetic range, recommend exercise and carb modified diet  Code Status: Full CODE STATUS DVT Prophylaxis:  Lovenox Family Communication: Discussed in detail with the patient, all imaging results, lab results explained to the patient and mother at the bedside   Disposition Plan: DC home in a.m. per patient's request  Time Spent in minutes 25 minutes  Procedures:  None  Consultants:   Infectious disease Dermatology on-call at [facility], Dr. Dareen Piano  Antimicrobials:   Linezolid 3/15   Medications  Scheduled Meds: . enoxaparin (LOVENOX) injection  40 mg Subcutaneous Daily  . hydrochlorothiazide  12.5 mg Oral Daily  . linezolid  600 mg Oral Q12H  . losartan  50 mg Oral Daily  . nicotine  21 mg Transdermal Daily  . nystatin   Topical TID   Continuous Infusions: . sodium  chloride 75 mL/hr at 04/14/17 1247  . fluconazole (DIFLUCAN) IV Stopped (04/13/17 2350)   PRN Meds:.acetaminophen **OR** acetaminophen, albuterol, diphenhydrAMINE, hydrALAZINE, ondansetron **OR** ondansetron (ZOFRAN) IV   Antibiotics   Anti-infectives (From admission, onward)   Start     Dose/Rate Route Frequency Ordered Stop   04/13/17 1800  linezolid (ZYVOX) tablet 600 mg     600 mg Oral Every 12 hours 04/13/17 1103 04/17/17 0959   04/13/17 1100  linezolid (ZYVOX) tablet 600 mg  Status:  Discontinued     600 mg Oral Every 12 hours 04/13/17 1059 04/13/17 1103   04/13/17 0315  fluconazole (DIFLUCAN) IVPB 400 mg     400 mg 100 mL/hr over 120 Minutes Intravenous Daily at bedtime 04/13/17 0257     04/13/17 0100   vancomycin (VANCOCIN) IVPB 1000 mg/200 mL premix  Status:  Discontinued     1,000 mg 200 mL/hr over 60 Minutes Intravenous Every 8 hours 04/13/17 0054 04/13/17 1059        Subjective:   John Velasquez was seen and examined today.  Feels a somewhat better, BP is uncontrolled, concerned about swollen feet.  Patient denies dizziness, chest pain, shortness of breath, abdominal pain, N/V/D/C.     Objective:   Vitals:   04/13/17 1806 04/13/17 2041 04/14/17 0446 04/14/17 0914  BP: (!) 147/107 (!) 155/102 (!) 152/118 (!) 150/111  Pulse: 93 93 83 90  Resp: 20 19 20 20   Temp: 98.2 F (36.8 C) 98.9 F (37.2 C) 98.6 F (37 C) 97.7 F (36.5 C)  TempSrc: Oral Oral Oral Oral  SpO2: 99% 100% 96% 98%  Weight:  95.7 kg (210 lb 15.7 oz)    Height:        Intake/Output Summary (Last 24 hours) at 04/14/2017 1305 Last data filed at 04/14/2017 0900 Gross per 24 hour  Intake 3490 ml  Output 1750 ml  Net 1740 ml     Wt Readings from Last 3 Encounters:  04/13/17 95.7 kg (210 lb 15.7 oz)  03/17/16 106.4 kg (234 lb 8 oz)  03/10/16 105.7 kg (233 lb)     Exam   General: Alert and oriented x 3, NAD  Eyes:   HEENT:    Cardiovascular: S1 S2 auscultated, no rubs, murmurs or gallops. Regular rate and rhythm. 1+ pedal edema b/l  Respiratory: Clear to auscultation bilaterally, no wheezing, rales or rhonchi  Gastrointestinal: Soft, nontender, nondistended, + bowel sounds  Ext: 1+ pedal edema bilaterally  Neuro: no new deficits  Musculoskeletal: No digital cyanosis, clubbing  Skin: Rash on the both feet,, whitish nystatin powder on the rash, less drainage today, similar rash on the unable and in the fingers  Psych: Normal affect and demeanor, alert and oriented x3    Data Reviewed:  I have personally reviewed following labs and imaging studies  Micro Results No results found for this or any previous visit (from the past 240 hour(s)).  Radiology Reports Dg Chest Port 1  View  Result Date: 04/13/2017 CLINICAL DATA:  Acute onset of tachycardia. EXAM: PORTABLE CHEST 1 VIEW COMPARISON:  None. FINDINGS: The lungs are well-aerated and clear. There is no evidence of focal opacification, pleural effusion or pneumothorax. The cardiomediastinal silhouette is borderline normal in size. No acute osseous abnormalities are seen. IMPRESSION: No acute cardiopulmonary process seen. Electronically Signed   By: Roanna Raider M.D.   On: 04/13/2017 04:52    Lab Data:  CBC: Recent Labs  Lab 04/12/17 1540 04/13/17 0406  WBC 9.8 8.3  NEUTROABS 7.7  --   HGB 14.3 12.3*  HCT 42.6 37.1*  MCV 81.0 81.5  PLT 144* 142*   Basic Metabolic Panel: Recent Labs  Lab 04/12/17 1540 04/14/17 0729  NA 137 140  K 3.4* 3.7  CL 106 107  CO2 21* 24  GLUCOSE 165* 114*  BUN 8 10  CREATININE 0.91 0.90  CALCIUM 8.8* 8.7*   GFR: Estimated Creatinine Clearance: 116.5 mL/min (by C-G formula based on SCr of 0.9 mg/dL). Liver Function Tests: Recent Labs  Lab 04/12/17 1540  AST 24  ALT 12*  ALKPHOS 58  BILITOT 1.1  PROT 6.8  ALBUMIN 3.7   No results for input(s): LIPASE, AMYLASE in the last 168 hours. No results for input(s): AMMONIA in the last 168 hours. Coagulation Profile: No results for input(s): INR, PROTIME in the last 168 hours. Cardiac Enzymes: No results for input(s): CKTOTAL, CKMB, CKMBINDEX, TROPONINI in the last 168 hours. BNP (last 3 results) No results for input(s): PROBNP in the last 8760 hours. HbA1C: Recent Labs    04/13/17 0406  HGBA1C 5.7*   CBG: No results for input(s): GLUCAP in the last 168 hours. Lipid Profile: No results for input(s): CHOL, HDL, LDLCALC, TRIG, CHOLHDL, LDLDIRECT in the last 72 hours. Thyroid Function Tests: Recent Labs    04/13/17 0406  TSH 0.871   Anemia Panel: No results for input(s): VITAMINB12, FOLATE, FERRITIN, TIBC, IRON, RETICCTPCT in the last 72 hours. Urine analysis: No results found for: COLORURINE,  APPEARANCEUR, LABSPEC, PHURINE, GLUCOSEU, HGBUR, BILIRUBINUR, KETONESUR, PROTEINUR, UROBILINOGEN, NITRITE, LEUKOCYTESUR   John Velasquez M.D. [facility] 04/14/2017, 1:05 PM  Pager: [phone] Between 7am to 7pm - call Pager - [phone]  After 7pm go to [email or web address] - password [email or web address]  Call night coverage person covering after 7pm

## 2017-04-14 NOTE — Plan of Care (Signed)
  Clinical Measurements: Ability to maintain clinical measurements within normal limits will improve 04/14/2017 1940 - Progressing by Luther Redo, RN   Skin Integrity: Risk for impaired skin integrity will decrease 04/14/2017 1940 - Progressing by Luther Redo, RN

## 2017-04-15 ENCOUNTER — Encounter (HOSPITAL_COMMUNITY): Payer: BLUE CROSS/BLUE SHIELD

## 2017-04-15 MED ORDER — LINEZOLID 600 MG PO TABS: 600.0000 mg | ORAL_TABLET | Freq: Two times a day (BID) | ORAL | 0 refills | Status: AC

## 2017-04-15 MED ORDER — DIPHENHYDRAMINE HCL 25 MG PO CAPS
25.0000 mg | ORAL_CAPSULE | Freq: Four times a day (QID) | ORAL | Status: DC | PRN
  Administered 2017-04-15: 25 mg via ORAL
  Filled 2017-04-15: qty 1

## 2017-04-15 MED ORDER — TERBINAFINE HCL 250 MG PO TABS
250.0000 mg | ORAL_TABLET | Freq: Every day | ORAL | Status: DC
  Administered 2017-04-15: 250 mg via ORAL
  Filled 2017-04-15: qty 1

## 2017-04-15 MED ORDER — TERBINAFINE HCL 250 MG PO TABS: 250.0000 mg | ORAL_TABLET | Freq: Every day | ORAL | 0 refills | Status: AC

## 2017-04-15 MED ORDER — NYSTATIN 100000 UNIT/GM EX POWD: Freq: Three times a day (TID) | CUTANEOUS | 6 refills | Status: DC

## 2017-04-15 MED ORDER — DIPHENHYDRAMINE HCL 25 MG PO CAPS: 25.0000 mg | ORAL_CAPSULE | Freq: Four times a day (QID) | ORAL | 0 refills | Status: DC | PRN

## 2017-04-15 MED ORDER — LOSARTAN POTASSIUM 100 MG PO TABS: 100.0000 mg | ORAL_TABLET | Freq: Every day | ORAL | 3 refills | Status: DC

## 2017-04-15 MED ORDER — LOSARTAN POTASSIUM 50 MG PO TABS
100.0000 mg | ORAL_TABLET | Freq: Every day | ORAL | Status: DC
  Administered 2017-04-15: 100 mg via ORAL
  Filled 2017-04-15: qty 2

## 2017-04-15 MED ORDER — HYDROCHLOROTHIAZIDE 12.5 MG PO CAPS: 12.5000 mg | ORAL_CAPSULE | Freq: Every day | ORAL | 3 refills | Status: DC

## 2017-04-15 NOTE — Discharge Summary (Signed)
Physician Discharge Summary   Patient ID: AEDDON MERRYFIELD MRN: [id number] DOB/AGE: 07-02-1965 52 y.o.  Admit date: 04/12/2017 Discharge date: 04/15/2017  Primary Care Physician:  Renford Dills, MD   Recommendations for Outpatient Follow-up:  1. Follow up with PCP per your appointment  2. Please obtain BMP/CBC in one week  Home Health: none  Equipment/Devices: None  Discharge Condition: stable  CODE STATUS: FULL  Diet recommendation: Heart healthy diet   Discharge Diagnoses:    . Cellulitis . Tinea infection with bacterial superinfection Uncontrolled hypertension . Lactic acidosis . Hypokalemia . Tobacco abuse . Hyperglycemia   Consults:   Infectious disease, Dr. Luciana [facility] via phone, [facility], Dr. Dareen Piano    Allergies:  No Known Allergies   DISCHARGE MEDICATIONS: Allergies as of 04/15/2017   No Known Allergies     Medication List    TAKE these medications   acetaminophen 500 MG tablet Commonly known as:  TYLENOL Take 1 tablet (500 mg total) by mouth every 6 (six) hours as needed. What changed:  reasons to take this   diphenhydrAMINE 25 mg capsule Commonly known as:  BENADRYL Take 1 capsule (25 mg total) by mouth every 6 (six) hours as needed for itching. Over the counter   hydrochlorothiazide 12.5 MG capsule Commonly known as:  MICROZIDE Take 1 capsule (12.5 mg total) by mouth daily.   linezolid 600 MG tablet Commonly known as:  ZYVOX Take 1 tablet (600 mg total) by mouth 2 (two) times daily for 4 doses. X 2 more days   losartan 100 MG tablet Commonly known as:  COZAAR Take 1 tablet (100 mg total) by mouth daily.   nystatin powder Commonly known as:  MYCOSTATIN/NYSTOP Apply topically 3 (three) times daily. Apply to feet, hands   terbinafine 250 MG tablet Commonly known as:  LAMISIL Take 1 tablet (250 mg total) by mouth daily for 14 days.        Brief H and P: For complete details please refer to admission H and P, but in  brief Patient is a 52 year old male with hypertension, presented with worsening skin infection.  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  Per patient as a result his feet sweat a lot and he developed a rash on them approximately 2 months ago.  No improvement with over-the-counter creams and sprays.  Patient saw podiatry 2 weeks ago and was started on 10-day course of Augmentin and Lamisil with no significant improvement.  In the last 1 week patient noticed purulent foul-smelling drainage coming from his feet, also similar rash on his navel, fingers, groin.   Hospital Course:   Cellulitis superimposed on tinea infection -Failed outpatient treatment with Augmentin and Lamisil -HIV negative, CRP 2.6 -Patient was started on IV vancomycin and fluconazole while inpatient.  ID was consulted, recommended terbinafine 250 mg daily with linezolid at discharge with nystatin powder. -Discussed with dermatology, Dr. Dareen Piano at [facility], recommended continue current management and arranged outpatient appointment with Dr Marylou Flesher on 3/19 at 10:00AM.   -Continue current management, provided education about feet hygiene with tinea infection  Uncontrolled hypertension -BP was elevated with multiple readings, started on hydrochlorothiazide 12.5 mg daily with losartan, increase to 100 mg daily -Strongly recommended to follow-up with PCP, patient has an appointment on 5/24 with [facility] physicians    Lactic acidosis -Lactic acid 2.06 at the time of admission, improved with IV fluid hydration. No SIRS or sepsis.     Hypokalemia -Patient  received potassium replacement    Tobacco abuse -Counseled on smoking cessation     Hyperglycemia -Hemoglobin A1c mildly elevated at 5.7, prediabetic range, recommend exercise and carb modified diet    Day of Discharge S: No new complaints, improving  BP (!) 132/107 (BP Location: Left Arm)   Pulse 96   Temp 97.9 F  (36.6 C) (Oral)   Resp 18   Ht 6' (1.829 m)   Wt 95.6 kg (210 lb 12.2 oz)   SpO2 97%   BMI 28.58 kg/m   Physical Exam: General: Alert and awake oriented x3 not in any acute distress. HEENT: anicteric sclera, pupils reactive to light and accommodation CVS: S1-S2 clear no murmur rubs or gallops Chest: clear to auscultation bilaterally, no wheezing rales or rhonchi Abdomen: soft nontender, nondistended, normal bowel sounds Extremities no cyanosis or clubbing Neuro: Cranial nerves II-XII intact, no focal neurological deficits Skin: Bilateral feet continue infection, nystatin powder on the rash, no active drainage.  Rash on the navel area and hands improving   The results of significant diagnostics from this hospitalization (including imaging, microbiology, ancillary and laboratory) are listed below for reference.      Procedures/Studies:  Dg Chest Port 1 View  Result Date: 04/13/2017 CLINICAL DATA:  Acute onset of tachycardia. EXAM: PORTABLE CHEST 1 VIEW COMPARISON:  None. FINDINGS: The lungs are well-aerated and clear. There is no evidence of focal opacification, pleural effusion or pneumothorax. The cardiomediastinal silhouette is borderline normal in size. No acute osseous abnormalities are seen. IMPRESSION: No acute cardiopulmonary process seen. Electronically Signed   By: Roanna Raider M.D.   On: 04/13/2017 04:52      LAB RESULTS: Basic Metabolic Panel: Recent Labs  Lab 04/12/17 1540 04/14/17 0729  NA 137 140  K 3.4* 3.7  CL 106 107  CO2 21* 24  GLUCOSE 165* 114*  BUN 8 10  CREATININE 0.91 0.90  CALCIUM 8.8* 8.7*   Liver Function Tests: Recent Labs  Lab 04/12/17 1540  AST 24  ALT 12*  ALKPHOS 58  BILITOT 1.1  PROT 6.8  ALBUMIN 3.7   No results for input(s): LIPASE, AMYLASE in the last 168 hours. No results for input(s): AMMONIA in the last 168 hours. CBC: Recent Labs  Lab 04/12/17 1540 04/13/17 0406  WBC 9.8 8.3  NEUTROABS 7.7  --   HGB 14.3 12.3*   HCT 42.6 37.1*  MCV 81.0 81.5  PLT 144* 142*   Cardiac Enzymes: No results for input(s): CKTOTAL, CKMB, CKMBINDEX, TROPONINI in the last 168 hours. BNP: Invalid input(s): POCBNP CBG: No results for input(s): GLUCAP in the last 168 hours.    Disposition and Follow-up: Discharge Instructions    Diet - low sodium heart healthy   Complete by:  As directed    Discharge instructions   Complete by:  As directed    For Tinea infection of feet: Reduce recurrences include use of desiccating foot powders, treatment of shoes with antifungal powder and avoidance of occlusive footwear.   Increase activity slowly   Complete by:  As directed        DISPOSITION: home    DISCHARGE FOLLOW-UP Follow-up Information    Renford Dills, MD Follow up on 06/22/2017.   Specialty:  Internal Medicine Why:  per your appointment  Contact information: [address] [place] [address] [phone]        Kandice Hams, MD Follow up on 04/17/2017.   Specialty:  Dermatology Why:  at 10:00 AM (  DERMATOLOGY) Contact information: [address] [place] [facility] [address] [phone]            Time coordinating discharge:    Signed:   Thad Ranger M.D. [facility] 04/15/2017, 10:08 AM Pager: [phone]

## 2017-04-15 NOTE — Discharge Instructions (Signed)
Athlete's Foot Athlete's foot (tinea pedis) is a fungal infection of the skin on the feet. It often occurs on the skin that is between or underneath the toes. It can also occur on the soles of the feet. The infection can spread from person to person (is contagious). What are the causes? Athlete's foot is caused by a fungus. This fungus grows in warm, moist places. Most people get athlete's foot by sharing shower stalls, towels, and wet floors with someone who is infected. Not washing your feet or changing your socks often enough can contribute to athlete's foot. What increases the risk? This condition is more likely to develop in:  Men.  People who have a weak body defense system (immune system).  People who have diabetes.  People who use public showers, such as at a gym.  People who wear heavy-duty shoes, such as industrial or military shoes.  Seasons with warm, humid weather.  What are the signs or symptoms? Symptoms of this condition include:  Itchy areas between the toes or on the soles of the feet.  White, flaky, or scaly areas between the toes or on the soles of the feet.  Very itchy small blisters between the toes or on the soles of the feet.  Small cuts on the skin. These cuts can become infected.  Thick or discolored toenails.  How is this diagnosed? This condition is diagnosed with a medical history and physical exam. Your health care provider may also take a skin or toenail sample to be examined. How is this treated? Treatment for this condition includes antifungal medicines. These may be applied as powders, ointments, or creams. In severe cases, an oral antifungal medicine may be given. Follow these instructions at home:  Apply or take over-the-counter and prescription medicines only as told by your health care provider.  Keep all follow-up visits as told by your health care provider. This is important.  Do not scratch your feet.  Keep your feet dry: ? Wear  cotton or wool socks. Change your socks every day or if they become wet. ? Wear shoes that allow air to circulate, such as sandals or canvas tennis shoes.  Wash and dry your feet: ? Every day or as told by your health care provider. ? After exercising. ? Including the area between your toes.  Do not share towels, nail clippers, or other personal items that touch your feet with others.  If you have diabetes, keep your blood sugar under control. How is this prevented?  Do not share towels.  Wear sandals in wet areas, such as locker rooms and shared showers.  Keep your feet dry: ? Wear cotton or wool socks. Change your socks every day or if they become wet. ? Wear shoes that allow air to circulate, such as sandals or canvas tennis shoes.  Wash and dry your feet after exercising. Pay attention to the area between your toes. Contact a health care provider if:  You have a fever.  You have swelling, soreness, warmth, or redness in your foot.  You are not getting better with treatment.  Your symptoms get worse.  You have new symptoms. This information is not intended to replace advice given to you by your health care provider. Make sure you discuss any questions you have with your health care provider. Document Released: 01/14/2000 Document Revised: 06/24/2015 Document Reviewed: 07/20/2014 Elsevier Interactive Patient Education  2018 Elsevier Inc.  

## 2017-04-15 NOTE — Care Management Note (Signed)
Case Management Note Donn Pierini RN, BSN Unit 4E-Case Manager-- weekend coverage 67M [phone]  Patient Details  Name: John Velasquez MRN: [id number] Date of Birth: 28-Jul-1965  Subjective/Objective:  Pt admitted with cellulitis                  Action/Plan: PTA pt lived at home with wife- plan to return home with wife- referral received for PCP needs- pt has [employer] insurance- [facility] is not accepting new pt at this time- spoke with pt at bedside he has an appointment with Dr. Nehemiah Settle at [facility] in May is trying to find a provider that can see him sooner- he use to go to [place]- may try to call them to see if someone can see him sooner- also provided pt with [facility] # for referral assistance.   Expected Discharge Date:  04/15/17               Expected Discharge Plan:  Home/Self Care  In-House Referral:  NA  Discharge planning Services  CM Consult, Indigent Health Clinic, Other - See comment  Post Acute Care Choice:  NA Choice offered to:  NA  DME Arranged:    DME Agency:     HH Arranged:    HH Agency:     Status of Service:  Completed, signed off  If discussed at [employer] of Stay Meetings, dates discussed:    Additional Comments:  Darrold Span, RN 04/15/2017, 10:56 AM

## 2017-04-15 NOTE — Progress Notes (Signed)
VASCULAR LAB    Patient ready for discharge, refused to have venous duplex.     Hawley Michel, RVT 04/15/2017, 11:14 AM   +

## 2017-04-17 DIAGNOSIS — R21 Rash and other nonspecific skin eruption: Secondary | ICD-10-CM | POA: Diagnosis not present

## 2017-04-17 DIAGNOSIS — L309 Dermatitis, unspecified: Secondary | ICD-10-CM | POA: Diagnosis not present

## 2017-04-17 DIAGNOSIS — L089 Local infection of the skin and subcutaneous tissue, unspecified: Secondary | ICD-10-CM | POA: Diagnosis not present

## 2017-04-18 ENCOUNTER — Ambulatory Visit: Payer: Self-pay | Admitting: Podiatry

## 2017-04-18 LAB — CULTURE, BLOOD (ROUTINE X 2)
CULTURE: NO GROWTH
Culture: NO GROWTH
Special Requests: ADEQUATE
Special Requests: ADEQUATE

## 2017-08-28 DIAGNOSIS — I10 Essential (primary) hypertension: Secondary | ICD-10-CM | POA: Diagnosis not present

## 2017-08-28 DIAGNOSIS — W260XXA Contact with knife, initial encounter: Secondary | ICD-10-CM | POA: Diagnosis not present

## 2017-08-28 DIAGNOSIS — S71111A Laceration without foreign body, right thigh, initial encounter: Secondary | ICD-10-CM | POA: Diagnosis not present

## 2017-08-28 DIAGNOSIS — F17200 Nicotine dependence, unspecified, uncomplicated: Secondary | ICD-10-CM | POA: Diagnosis not present

## 2017-10-15 ENCOUNTER — Ambulatory Visit (INDEPENDENT_AMBULATORY_CARE_PROVIDER_SITE_OTHER): Payer: BLUE CROSS/BLUE SHIELD | Admitting: Physician Assistant

## 2018-03-04 ENCOUNTER — Emergency Department (HOSPITAL_COMMUNITY)
Admission: EM | Admit: 2018-03-04 | Discharge: 2018-03-04 | Disposition: A | Discharge status: Home / Self Care | Payer: BLUE CROSS/BLUE SHIELD | Attending: Emergency Medicine | Admitting: Emergency Medicine

## 2018-03-04 ENCOUNTER — Encounter (HOSPITAL_COMMUNITY): Payer: Self-pay

## 2018-03-04 DIAGNOSIS — I10 Essential (primary) hypertension: Secondary | ICD-10-CM | POA: Insufficient documentation

## 2018-03-04 DIAGNOSIS — J069 Acute upper respiratory infection, unspecified: Secondary | ICD-10-CM | POA: Diagnosis not present

## 2018-03-04 DIAGNOSIS — Z79899 Other long term (current) drug therapy: Secondary | ICD-10-CM | POA: Diagnosis not present

## 2018-03-04 DIAGNOSIS — R110 Nausea: Secondary | ICD-10-CM | POA: Diagnosis not present

## 2018-03-04 DIAGNOSIS — R0981 Nasal congestion: Secondary | ICD-10-CM | POA: Diagnosis not present

## 2018-03-04 DIAGNOSIS — F17210 Nicotine dependence, cigarettes, uncomplicated: Secondary | ICD-10-CM | POA: Insufficient documentation

## 2018-03-04 MED ORDER — FLUTICASONE PROPIONATE 50 MCG/ACT NA SUSP: 2.0000 | Freq: Every day | NASAL | 0 refills | Status: DC

## 2018-03-04 MED ORDER — BENZONATATE 100 MG PO CAPS: 100.0000 mg | ORAL_CAPSULE | Freq: Three times a day (TID) | ORAL | 0 refills | Status: AC

## 2018-03-04 NOTE — Discharge Instructions (Addendum)

## 2018-03-04 NOTE — ED Provider Notes (Signed)
[facility]-EMERGENCY DEPT Provider Note   CSN: [id number] Arrival date & time: 03/04/18  1525     History   Chief Complaint Chief Complaint  Patient presents with  . Nasal Congestion  . Cough    HPI John Velasquez is a 53 y.o. male.  HPI   Pt is a 53 y/o male with ah /o htn who presents to the ED today c/o chills, sweats, nasal congestion, and cough that began 4 days ago.  He denies sore throat. He has and some nausea, but no vomiting. No diarrhea, abd pain, shortness of breath or chest pain.   He has tried flonase and OTC cough medication with no significant relief.   Past Medical History:  Diagnosis Date  . Cellulitis 04/13/2017   BILATERAL FEET  . Hypertension     Patient Active Problem List   Diagnosis Date Noted  . Cellulitis 04/13/2017  . Tinea 04/13/2017  . Lactic acidosis 04/13/2017  . Hypokalemia 04/13/2017  . Tobacco abuse 04/13/2017  . Hyperglycemia 04/13/2017  . Cellulitis of foot   . HTN (hypertension), benign 03/10/2016  . Leukonychia 03/10/2016  . 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    Past Surgical History:  Procedure Laterality Date  . TONSILLECTOMY          Home Medications    Prior to Admission medications   Medication Sig Start Date End Date Taking? Authorizing Provider  acetaminophen (TYLENOL) 500 MG tablet Take 1 tablet (500 mg total) by mouth every 6 (six) hours as needed. Patient taking differently: Take 500 mg by mouth every 6 (six) hours as needed for mild pain.  01/02/14   Cartner, Sharlet Salina, PA-C  benzonatate (TESSALON) 100 MG capsule Take 1 capsule (100 mg total) by mouth every 8 (eight) hours for 5 days. 03/04/18 03/09/18  Rowe Warman S, PA-C  diphenhydrAMINE (BENADRYL) 25 mg capsule Take 1 capsule (25 mg total) by mouth every 6 (six) hours as needed for itching. Over the counter 04/15/17   Rai, Delene Ruffini, MD  fluticasone (FLONASE) 50 MCG/ACT nasal spray Place 2 sprays into both nostrils daily. 03/04/18   Malonie Tatum,  Milayah Krell S, PA-C  hydrochlorothiazide (MICROZIDE) 12.5 MG capsule Take 1 capsule (12.5 mg total) by mouth daily. 04/15/17   Rai, Delene Ruffini, MD  losartan (COZAAR) 100 MG tablet Take 1 tablet (100 mg total) by mouth daily. 04/15/17   Rai, Delene Ruffini, MD  nystatin (MYCOSTATIN/NYSTOP) powder Apply topically 3 (three) times daily. Apply to feet, hands 04/15/17   Rai, Delene Ruffini, MD    Family History Family History  Problem Relation Age of Onset  . Cancer Maternal Grandmother        unknown  . Cancer Paternal Grandmother        unknown  . Heart disease Mother   . Hypertension Mother   . Cancer Father        throat  . Alcohol abuse Father     Social History Social History   Tobacco Use  . Smoking status: Current Every Day Smoker    Packs/day: 1.00    Years: 35.00    Pack years: 35.00    Types: Cigarettes    Last attempt to quit: 04/13/2017    Years since quitting: 0.8  . Smokeless tobacco: Never Used  Substance Use Topics  . Alcohol use: No  . Drug use: No     Allergies   Patient has no known allergies.   Review of Systems Review of Systems  Constitutional: Positive for chills  and diaphoresis.  HENT: Positive for congestion and rhinorrhea. Negative for sore throat.   Eyes: Negative for visual disturbance.  Respiratory: Positive for cough. Negative for shortness of breath.   Cardiovascular: Negative for chest pain.  Gastrointestinal: Positive for nausea. Negative for abdominal pain, constipation and diarrhea.  Genitourinary: Negative for flank pain.  Musculoskeletal: Negative for myalgias.  Skin: Negative for rash.  Neurological: Negative for headaches.    Physical Exam Updated Vital Signs BP (!) 154/113 (BP Location: Right Arm)   Pulse 100   Temp 98.6 F (37 C) (Oral)   Resp 16   Ht 6' (1.829 m)   Wt 95.3 kg   SpO2 99%   BMI 28.48 kg/m   Physical Exam Vitals signs and nursing note reviewed.  Constitutional:      Appearance: He is well-developed. He is not  ill-appearing or toxic-appearing.  HENT:     Head: Normocephalic and atraumatic.     Right Ear: Tympanic membrane normal.     Left Ear: Tympanic membrane normal.     Nose: Congestion present.     Mouth/Throat:     Mouth: Mucous membranes are moist.     Pharynx: No oropharyngeal exudate or posterior oropharyngeal erythema.  Eyes:     Conjunctiva/sclera: Conjunctivae normal.  Neck:     Musculoskeletal: Neck supple.  Cardiovascular:     Rate and Rhythm: Normal rate and regular rhythm.     Heart sounds: Normal heart sounds. No murmur.  Pulmonary:     Effort: Pulmonary effort is normal. No respiratory distress.     Breath sounds: Normal breath sounds. No stridor. No wheezing or rhonchi.  Abdominal:     General: Bowel sounds are normal.     Palpations: Abdomen is soft.     Tenderness: There is no abdominal tenderness.  Skin:    General: Skin is warm and dry.  Neurological:     Mental Status: He is alert.  Psychiatric:        Mood and Affect: Mood normal.      ED Treatments / Results  Labs (all labs ordered are listed, but only abnormal results are displayed) Labs Reviewed - No data to display  EKG None  Radiology No results found.  Procedures Procedures (including critical care time)  Medications Ordered in ED Medications - No data to display   Initial Impression / Assessment and Plan / ED Course  I have reviewed the triage vital signs and the nursing notes.  Pertinent labs & imaging results that were available during my care of the patient were reviewed by me and considered in my medical decision making (see chart for details).      Final Clinical Impressions(s) / ED Diagnoses   Final diagnoses:  Upper respiratory tract infection, unspecified type   Patients symptoms are consistent with URI, likely viral etiology. Discussed that antibiotics are not indicated for viral infections. Pt will be discharged with symptomatic treatment.  Verbalizes understanding and  is agreeable with plan. Pt is hemodynamically stable & in NAD prior to dc.  ED Discharge Orders         Ordered    benzonatate (TESSALON) 100 MG capsule  Every 8 hours     03/04/18 1932    fluticasone (FLONASE) 50 MCG/ACT nasal spray  Daily     03/04/18 1932           Karrie Meres, PA-C 03/04/18 1933    Tilden Fossa, MD 03/05/18 1538

## 2018-03-04 NOTE — ED Triage Notes (Addendum)
Patient c/o nasal congestion and a productive cough with clear sputum x 2 days.  Patient reports that he did not take his BP med today.

## 2018-03-04 NOTE — ED Notes (Signed)
PA made aware of d/c BP. Per PA, patient made aware to take BP medications tonight.

## 2018-10-16 IMAGING — DX DG CHEST 1V PORT
1 series · 1 of 1 positions shown · non-contrast
Comparison: None.

CLINICAL DATA: Acute onset of tachycardia.

EXAM:
PORTABLE CHEST 1 VIEW

[chest ap]
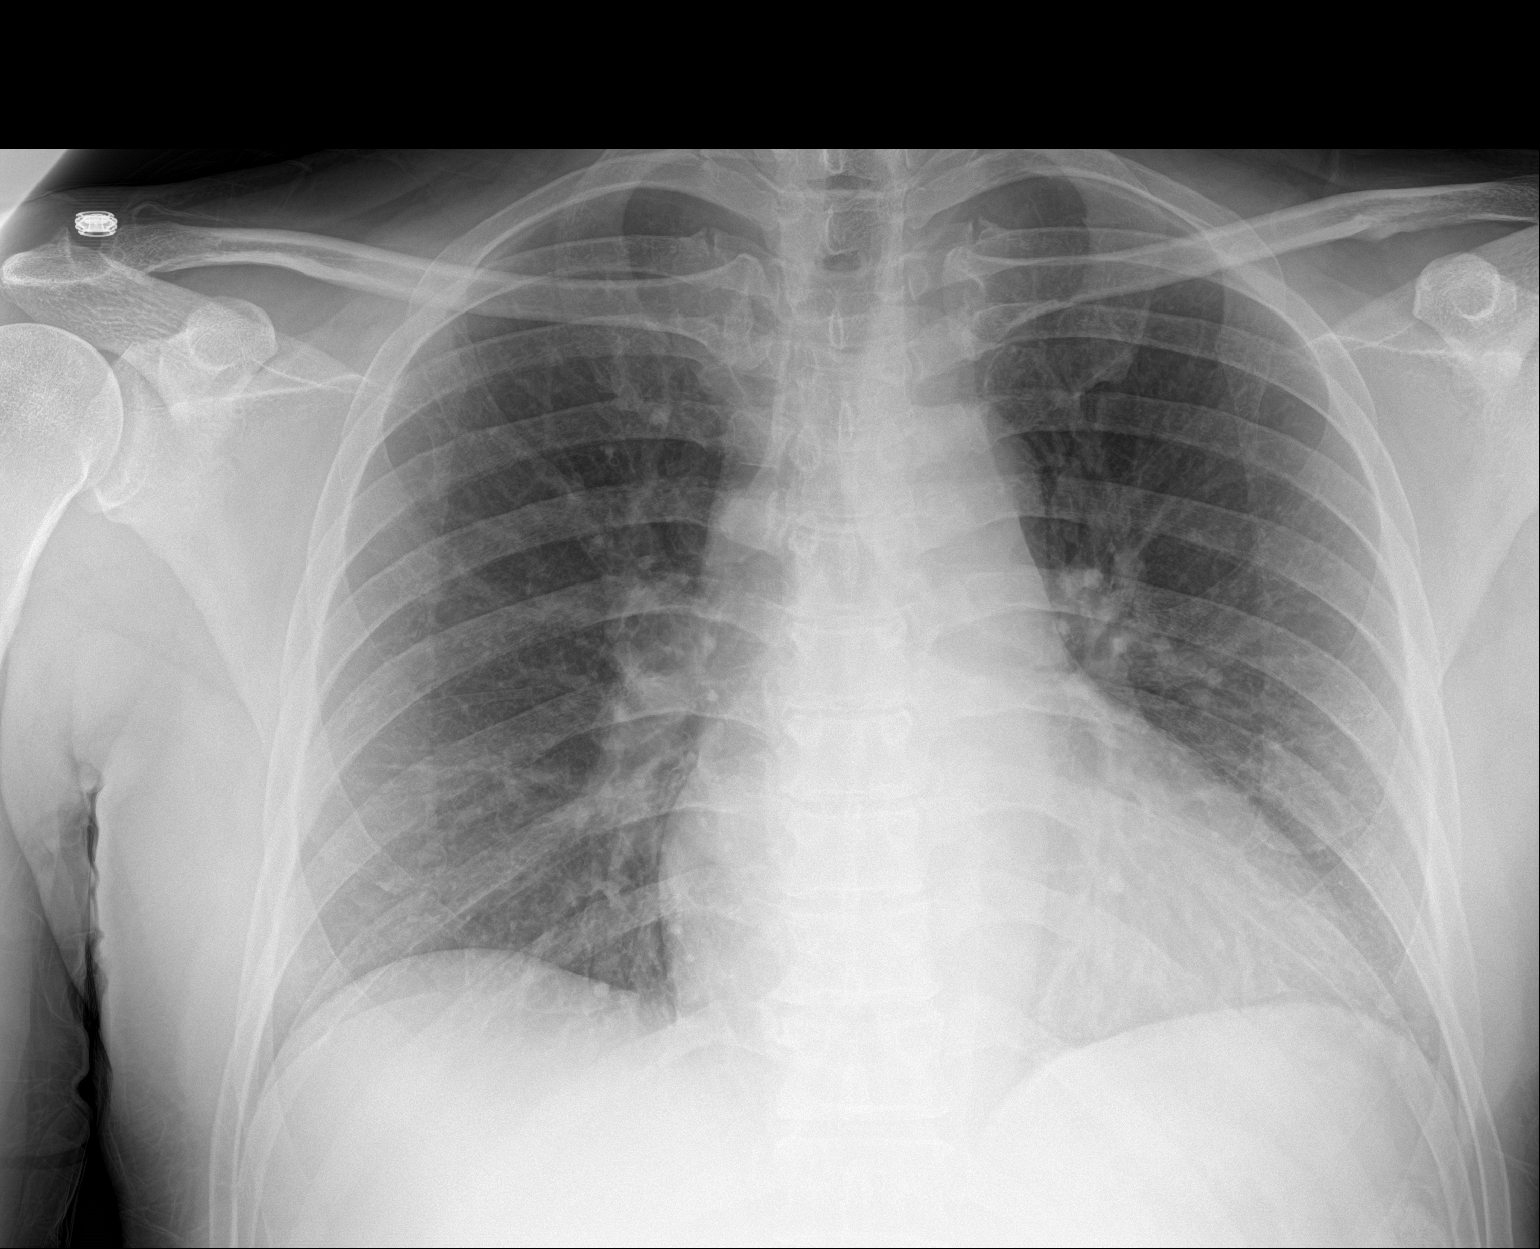

[1 of 1 positions shown; findings below may reference images not displayed]

FINDINGS: The lungs are well-aerated and clear. There is no evidence of focal
opacification, pleural effusion or pneumothorax.

The cardiomediastinal silhouette is borderline normal in size. No
acute osseous abnormalities are seen.
IMPRESSION: No acute cardiopulmonary process seen.

## 2021-05-14 ENCOUNTER — Emergency Department (HOSPITAL_COMMUNITY): Payer: BC Managed Care – PPO

## 2021-05-14 ENCOUNTER — Other Ambulatory Visit: Payer: Self-pay

## 2021-05-14 ENCOUNTER — Encounter (HOSPITAL_COMMUNITY): Payer: Self-pay | Admitting: Emergency Medicine

## 2021-05-14 ENCOUNTER — Emergency Department (HOSPITAL_COMMUNITY)
Admission: EM | Admit: 2021-05-14 | Discharge: 2021-05-14 | Disposition: A | Discharge status: Home / Self Care | Payer: BC Managed Care – PPO | Attending: Emergency Medicine | Admitting: Emergency Medicine

## 2021-05-14 DIAGNOSIS — F17290 Nicotine dependence, other tobacco product, uncomplicated: Secondary | ICD-10-CM | POA: Diagnosis not present

## 2021-05-14 DIAGNOSIS — Y92410 Unspecified street and highway as the place of occurrence of the external cause: Secondary | ICD-10-CM | POA: Diagnosis not present

## 2021-05-14 DIAGNOSIS — Z79899 Other long term (current) drug therapy: Secondary | ICD-10-CM | POA: Insufficient documentation

## 2021-05-14 DIAGNOSIS — M5136 Other intervertebral disc degeneration, lumbar region: Secondary | ICD-10-CM | POA: Diagnosis not present

## 2021-05-14 DIAGNOSIS — S161XXA Strain of muscle, fascia and tendon at neck level, initial encounter: Secondary | ICD-10-CM | POA: Insufficient documentation

## 2021-05-14 DIAGNOSIS — M542 Cervicalgia: Secondary | ICD-10-CM | POA: Diagnosis not present

## 2021-05-14 DIAGNOSIS — M545 Low back pain, unspecified: Secondary | ICD-10-CM | POA: Diagnosis not present

## 2021-05-14 DIAGNOSIS — I10 Essential (primary) hypertension: Secondary | ICD-10-CM | POA: Insufficient documentation

## 2021-05-14 DIAGNOSIS — M549 Dorsalgia, unspecified: Secondary | ICD-10-CM | POA: Diagnosis not present

## 2021-05-14 DIAGNOSIS — S199XXA Unspecified injury of neck, initial encounter: Secondary | ICD-10-CM | POA: Diagnosis not present

## 2021-05-14 DIAGNOSIS — S169XXA Unspecified injury of muscle, fascia and tendon at neck level, initial encounter: Secondary | ICD-10-CM | POA: Diagnosis not present

## 2021-05-14 DIAGNOSIS — M2578 Osteophyte, vertebrae: Secondary | ICD-10-CM | POA: Diagnosis not present

## 2021-05-14 MED ORDER — IBUPROFEN 800 MG PO TABS: 800.0000 mg | ORAL_TABLET | Freq: Once | ORAL | Status: DC

## 2021-05-14 MED ORDER — IBUPROFEN 800 MG PO TABS
800.0000 mg | ORAL_TABLET | Freq: Once | ORAL | Status: AC
  Administered 2021-05-14: 800 mg via ORAL
  Filled 2021-05-14: qty 1

## 2021-05-14 MED ORDER — CYCLOBENZAPRINE HCL 10 MG PO TABS: 10.0000 mg | ORAL_TABLET | Freq: Two times a day (BID) | ORAL | 0 refills | Status: DC | PRN

## 2021-05-14 MED ORDER — IBUPROFEN 800 MG PO TABS: 800.0000 mg | ORAL_TABLET | Freq: Three times a day (TID) | ORAL | 0 refills | Status: DC
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
## 2021-05-14 NOTE — ED Notes (Signed)
Pt in x-ray/CT at this time.

## 2021-05-14 NOTE — Discharge Instructions (Addendum)
Please take Motrin and Flexeril as needed for pain and spasms respectively.  Return to the emergency department for any worsening symptoms.  Otherwise I like for you to follow-up with your primary care provider. ?
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
## 2021-07-11 DIAGNOSIS — R079 Chest pain, unspecified: Secondary | ICD-10-CM | POA: Diagnosis not present

## 2021-07-11 DIAGNOSIS — R0789 Other chest pain: Secondary | ICD-10-CM | POA: Diagnosis not present

## 2021-07-11 DIAGNOSIS — I10 Essential (primary) hypertension: Secondary | ICD-10-CM | POA: Diagnosis not present

## 2021-10-31 DIAGNOSIS — I491 Atrial premature depolarization: Secondary | ICD-10-CM | POA: Diagnosis not present

## 2021-10-31 DIAGNOSIS — I10 Essential (primary) hypertension: Secondary | ICD-10-CM | POA: Diagnosis not present

## 2021-10-31 DIAGNOSIS — R079 Chest pain, unspecified: Secondary | ICD-10-CM | POA: Diagnosis not present

## 2021-10-31 DIAGNOSIS — R0789 Other chest pain: Secondary | ICD-10-CM | POA: Diagnosis not present

## 2021-11-06 ENCOUNTER — Emergency Department (HOSPITAL_COMMUNITY): Payer: BC Managed Care – PPO

## 2021-11-06 ENCOUNTER — Other Ambulatory Visit: Payer: Self-pay

## 2021-11-06 ENCOUNTER — Encounter (HOSPITAL_COMMUNITY): Payer: Self-pay | Admitting: Emergency Medicine

## 2021-11-06 ENCOUNTER — Inpatient Hospital Stay (HOSPITAL_COMMUNITY)
Admission: EM | Admit: 2021-11-06 | Discharge: 2021-11-09 | DRG: 286 | Disposition: A | Discharge status: Home / Self Care | Payer: BC Managed Care – PPO | Attending: Internal Medicine | Admitting: Internal Medicine

## 2021-11-06 DIAGNOSIS — Z8679 Personal history of other diseases of the circulatory system: Secondary | ICD-10-CM | POA: Diagnosis not present

## 2021-11-06 DIAGNOSIS — I161 Hypertensive emergency: Secondary | ICD-10-CM | POA: Diagnosis not present

## 2021-11-06 DIAGNOSIS — I10 Essential (primary) hypertension: Secondary | ICD-10-CM | POA: Diagnosis present

## 2021-11-06 DIAGNOSIS — I5043 Acute on chronic combined systolic (congestive) and diastolic (congestive) heart failure: Secondary | ICD-10-CM | POA: Diagnosis not present

## 2021-11-06 DIAGNOSIS — Z87891 Personal history of nicotine dependence: Secondary | ICD-10-CM | POA: Diagnosis not present

## 2021-11-06 DIAGNOSIS — E785 Hyperlipidemia, unspecified: Secondary | ICD-10-CM | POA: Diagnosis present

## 2021-11-06 DIAGNOSIS — Z811 Family history of alcohol abuse and dependence: Secondary | ICD-10-CM | POA: Diagnosis not present

## 2021-11-06 DIAGNOSIS — I509 Heart failure, unspecified: Principal | ICD-10-CM

## 2021-11-06 DIAGNOSIS — I110 Hypertensive heart disease with heart failure: Principal | ICD-10-CM | POA: Diagnosis present

## 2021-11-06 DIAGNOSIS — Z79899 Other long term (current) drug therapy: Secondary | ICD-10-CM

## 2021-11-06 DIAGNOSIS — I5021 Acute systolic (congestive) heart failure: Secondary | ICD-10-CM

## 2021-11-06 DIAGNOSIS — Z880 Allergy status to penicillin: Secondary | ICD-10-CM

## 2021-11-06 DIAGNOSIS — Z808 Family history of malignant neoplasm of other organs or systems: Secondary | ICD-10-CM | POA: Diagnosis not present

## 2021-11-06 DIAGNOSIS — Z720 Tobacco use: Secondary | ICD-10-CM | POA: Diagnosis present

## 2021-11-06 DIAGNOSIS — R7989 Other specified abnormal findings of blood chemistry: Secondary | ICD-10-CM | POA: Diagnosis not present

## 2021-11-06 DIAGNOSIS — Z91148 Patient's other noncompliance with medication regimen for other reason: Secondary | ICD-10-CM

## 2021-11-06 DIAGNOSIS — Z8249 Family history of ischemic heart disease and other diseases of the circulatory system: Secondary | ICD-10-CM | POA: Diagnosis not present

## 2021-11-06 DIAGNOSIS — J90 Pleural effusion, not elsewhere classified: Secondary | ICD-10-CM | POA: Diagnosis not present

## 2021-11-06 DIAGNOSIS — I5033 Acute on chronic diastolic (congestive) heart failure: Secondary | ICD-10-CM

## 2021-11-06 DIAGNOSIS — R079 Chest pain, unspecified: Secondary | ICD-10-CM | POA: Diagnosis not present

## 2021-11-06 LAB — BASIC METABOLIC PANEL
Anion gap: 7 (ref 5–15)
BUN: 16 mg/dL (ref 6–20)
CO2: 26 mmol/L (ref 22–32)
Calcium: 8.9 mg/dL (ref 8.9–10.3)
Chloride: 107 mmol/L (ref 98–111)
Creatinine, Ser: 1.12 mg/dL (ref 0.61–1.24)
GFR, Estimated: >60 mL/min (ref >60)
Glucose, Bld: 112 mg/dL — ABNORMAL HIGH (ref 70–99)
Potassium: 3.6 mmol/L (ref 3.5–5.1)
Sodium: 140 mmol/L (ref 135–145)

## 2021-11-06 LAB — TROPONIN I (HIGH SENSITIVITY)
Troponin I (High Sensitivity): 46 ng/L — ABNORMAL HIGH (ref <18)
Troponin I (High Sensitivity): 42 ng/L — ABNORMAL HIGH (ref <18)

## 2021-11-06 LAB — CBC
HCT: 43.2 % (ref 39.0–52.0)
Hemoglobin: 14.3 g/dL (ref 13.0–17.0)
MCH: 27.2 pg (ref 26.0–34.0)
MCHC: 33.1 g/dL (ref 30.0–36.0)
MCV: 82.1 fL (ref 80.0–100.0)
Platelets: 103 10*3/uL — ABNORMAL LOW (ref 150–400)
RBC: 5.26 MIL/uL (ref 4.22–5.81)
RDW: 15.1 % (ref 11.5–15.5)
WBC: 6.1 10*3/uL (ref 4.0–10.5)
nRBC: 0 % (ref 0.0–0.2)

## 2021-11-06 LAB — BRAIN NATRIURETIC PEPTIDE: B Natriuretic Peptide: 717.3 pg/mL — ABNORMAL HIGH (ref 0.0–100.0)

## 2021-11-06 LAB — TSH: TSH: 1.41 u[IU]/mL (ref 0.350–4.500)

## 2021-11-06 LAB — D-DIMER, QUANTITATIVE: D-Dimer, Quant: 0.63 ug/mL-FEU — ABNORMAL HIGH (ref 0.00–0.50)

## 2021-11-06 MED ORDER — SODIUM CHLORIDE 0.9% FLUSH
3.0000 mL | Freq: Two times a day (BID) | INTRAVENOUS | Status: DC
  Administered 2021-11-07 (×2): 3 mL via INTRAVENOUS

## 2021-11-06 MED ORDER — ENOXAPARIN SODIUM 40 MG/0.4ML IJ SOSY
40.0000 mg | PREFILLED_SYRINGE | INTRAMUSCULAR | Status: DC
  Administered 2021-11-06 – 2021-11-07 (×2): 40 mg via SUBCUTANEOUS
  Filled 2021-11-06 (×3): qty 0.4

## 2021-11-06 MED ORDER — FLUTICASONE PROPIONATE 50 MCG/ACT NA SUSP
2.0000 | Freq: Every day | NASAL | Status: DC
  Administered 2021-11-06 – 2021-11-08 (×3): 2 via NASAL
  Filled 2021-11-06: qty 16

## 2021-11-06 MED ORDER — SODIUM CHLORIDE 0.9 % IV SOLN: 250.0000 mL | INTRAVENOUS | Status: DC | PRN

## 2021-11-06 MED ORDER — IOHEXOL 350 MG/ML SOLN
80.0000 mL | Freq: Once | INTRAVENOUS | Status: AC | PRN
  Administered 2021-11-06: 80 mL via INTRAVENOUS

## 2021-11-06 MED ORDER — DIPHENHYDRAMINE HCL 25 MG PO CAPS: 25.0000 mg | ORAL_CAPSULE | Freq: Four times a day (QID) | ORAL | Status: DC | PRN

## 2021-11-06 MED ORDER — LOSARTAN POTASSIUM 50 MG PO TABS
100.0000 mg | ORAL_TABLET | Freq: Every day | ORAL | Status: DC
  Administered 2021-11-06 – 2021-11-07 (×2): 100 mg via ORAL
  Filled 2021-11-06 (×2): qty 2

## 2021-11-06 MED ORDER — ONDANSETRON HCL 4 MG/2ML IJ SOLN: 4.0000 mg | Freq: Four times a day (QID) | INTRAMUSCULAR | Status: DC | PRN

## 2021-11-06 MED ORDER — FUROSEMIDE 10 MG/ML IJ SOLN
40.0000 mg | Freq: Two times a day (BID) | INTRAMUSCULAR | Status: AC
  Administered 2021-11-06 – 2021-11-07 (×3): 40 mg via INTRAVENOUS
  Filled 2021-11-06 (×3): qty 4

## 2021-11-06 MED ORDER — ACETAMINOPHEN 325 MG PO TABS: 650.0000 mg | ORAL_TABLET | ORAL | Status: DC | PRN

## 2021-11-06 MED ORDER — NITROGLYCERIN 0.4 MG SL SUBL
0.4000 mg | SUBLINGUAL_TABLET | SUBLINGUAL | Status: DC | PRN
  Administered 2021-11-06 (×3): 0.4 mg via SUBLINGUAL
  Filled 2021-11-06: qty 1

## 2021-11-06 MED ORDER — CARVEDILOL 3.125 MG PO TABS
3.1250 mg | ORAL_TABLET | Freq: Two times a day (BID) | ORAL | Status: DC
  Administered 2021-11-06 – 2021-11-09 (×6): 3.125 mg via ORAL
  Filled 2021-11-06 (×6): qty 1

## 2021-11-06 MED ORDER — SODIUM CHLORIDE 0.9% FLUSH: 3.0000 mL | INTRAVENOUS | Status: DC | PRN

## 2021-11-06 MED ORDER — ACETAMINOPHEN 500 MG PO TABS: 500.0000 mg | ORAL_TABLET | Freq: Four times a day (QID) | ORAL | Status: DC | PRN

## 2021-11-06 MED ORDER — CYCLOBENZAPRINE HCL 10 MG PO TABS: 10.0000 mg | ORAL_TABLET | Freq: Two times a day (BID) | ORAL | Status: DC | PRN

## 2021-11-06 MED ORDER — ASPIRIN 81 MG PO TBEC
81.0000 mg | DELAYED_RELEASE_TABLET | Freq: Every day | ORAL | Status: DC
  Administered 2021-11-06 – 2021-11-07 (×2): 81 mg via ORAL
  Filled 2021-11-06 (×2): qty 1

## 2021-11-06 NOTE — ED Triage Notes (Signed)
Pt reported to ED with c/o left sided chest pain and tightness that has been occurring intermittently over past few weeks. Pt also endorses shortness of breath. Denies dizziness or nausea/vomiting.

## 2021-11-06 NOTE — ED Provider Triage Note (Addendum)
Emergency Medicine Provider Triage Evaluation Note  John Velasquez , a 56 y.o. male  was evaluated in triage.  Pt complains of chest tightness and some shortness of breath since midnight last night. He states over the past 2 weeks he has had some intermittent episodes of this.  He has a history of untreated hypertension and states that he has not followed with his primary care doctor regularly.  He has been smoking a pack a day for many years.  He states he is uncertain of any history of diabetes, or hyperlipidemia.  He states that he feels like he is having an anxiety attack but when asked to describe the symptoms he describes it as chest tightness.  Review of Systems  Positive: Chest pressure Negative: Fever  Physical Exam  BP (!) 156/131   Pulse 98   Temp 98 F (36.7 C) (Oral)   Resp 14   SpO2 100%  Gen:   Awake, no distress   Resp:  Normal effort  MSK:   Moves extremities without difficulty  Other:  Faint crackles in bases, occasionally irregular with long periods of regular beats.   Medical Decision Making  Medically screening exam initiated at 10:38 AM.  Appropriate orders placed.  John Velasquez was informed that the remainder of the evaluation will be completed by another provider, this initial triage assessment does not replace that evaluation, and the importance of remaining in the ED until their evaluation is complete.  Labs, CXR, ekg   Tedd Sias, [place] 11/06/21 1040    [name] [place], [place] 11/06/21 1040

## 2021-11-06 NOTE — H&P (Signed)
History and Physical    WYLAN GENTZLER Velasquez:[id number] DOB: Apr 06, 1965 DOA: 11/06/2021  PCP: Seward Carol, MD (Confirm with patient/family/NH records and if not entered, this has to be entered at [facility] point of entry) Patient coming from: Home  I have personally briefly reviewed patient's old medical records in [place]  Chief Complaint: SOB  HPI: John Velasquez is a 56 y.o. male with medical history significant of HTN, noncompliant with BP meds, presented with increasing shortness of breath.  Symptoms started 2 weeks ago, with new onset of exertional dyspnea.  Walking 5 minutes trigger significant shortness of breath and had to stop and to catch his breath.  No cough no fever chills no chest pains.  6 days ago, patient developed orthopnea, and could not sleep overnight call EMS in the morning, EMS arrived and found patient blood pressure slightly elevated and recommended patient come to the hospital however patient deferred and stay at home.  Last night, patient developed severe orthopnea again and pressure-like chest pain, 3-4/10, nonradiating associated with severe shortness of breath.  No cough no fever chills denies any peripheral edema.  Patient was diagnosed with hypertension 5-6 years ago, 3 years ago he decided stop taking the blood pressure medication "I did not feel I needed them anymore" and has not been following up with any physician in 3 years.  ED Course: Initial blood pressure significantly elevated systolic of 956, diastolic more than 213, patient was given 1 nitroglycerin sublingual and breathing symptoms significantly improved.  CT angiogram negative for PE but bilateral pulmonary congestion and bilateral pleural effusion right> left.  Review of Systems: As per HPI otherwise 14 point review of systems negative.    Past Medical History:  Diagnosis Date   Cellulitis 04/13/2017   BILATERAL FEET   Hypertension     Past Surgical History:  Procedure Laterality Date    TONSILLECTOMY       reports that he has been smoking cigarettes. He has a 35.00 pack-year smoking history. He has never used smokeless tobacco. He reports that he does not drink alcohol and does not use drugs.  No Known Allergies  Family History  Problem Relation Age of Onset   Cancer Maternal Grandmother        unknown   Cancer Paternal Grandmother        unknown   Heart disease Mother    Hypertension Mother    Cancer Father        throat   Alcohol abuse Father      Prior to Admission medications   Medication Sig Start Date End Date Taking? Authorizing Provider  acetaminophen (TYLENOL) 500 MG tablet Take 1 tablet (500 mg total) by mouth every 6 (six) hours as needed. Patient taking differently: Take 500 mg by mouth every 6 (six) hours as needed for mild pain.  01/02/14   Cartner, Marland Kitchen, PA-C  cyclobenzaprine (FLEXERIL) 10 MG tablet Take 1 tablet (10 mg total) by mouth 2 (two) times daily as needed for muscle spasms. 05/14/21   Hendricks Limes, PA-C  diphenhydrAMINE (BENADRYL) 25 mg capsule Take 1 capsule (25 mg total) by mouth every 6 (six) hours as needed for itching. Over the counter 04/15/17   Rai, Vernelle Emerald, MD  fluticasone (FLONASE) 50 MCG/ACT nasal spray Place 2 sprays into both nostrils daily. 03/04/18   Couture, Cortni S, PA-C  hydrochlorothiazide (MICROZIDE) 12.5 MG capsule Take 1 capsule (12.5 mg total) by mouth daily. 04/15/17   Mendel Corning, MD  ibuprofen (ADVIL) 800 MG tablet Take 1 tablet (800 mg total) by mouth 3 (three) times daily. 05/14/21   Honor Loh M, PA-C  losartan (COZAAR) 100 MG tablet Take 1 tablet (100 mg total) by mouth daily. 04/15/17   Rai, Delene Ruffini, MD  nystatin (MYCOSTATIN/NYSTOP) powder Apply topically 3 (three) times daily. Apply to feet, hands 04/15/17   Cathren Harsh, MD    Physical Exam: Vitals:   11/06/21 1320 11/06/21 1345 11/06/21 1401 11/06/21 1500  BP: (!) 148/112 (!) 130/117  (!) 121/98  Pulse: 89 82  77  Resp: (!) 22 (!)  24  16  Temp:   97.9 F (36.6 C)   TempSrc:      SpO2: 94% 95%  95%    Constitutional: NAD, calm, comfortable Vitals:   11/06/21 1320 11/06/21 1345 11/06/21 1401 11/06/21 1500  BP: (!) 148/112 (!) 130/117  (!) 121/98  Pulse: 89 82  77  Resp: (!) 22 (!) 24  16  Temp:   97.9 F (36.6 C)   TempSrc:      SpO2: 94% 95%  95%   Eyes: PERRL, lids and conjunctivae normal ENMT: Mucous membranes are moist. Posterior pharynx clear of any exudate or lesions.Normal dentition.  Neck: normal, supple, no masses, no thyromegaly.  JVD 6 cm above clavicle Respiratory: clear to auscultation bilaterally, no wheezing, n bilateral fine crackles on lung bases, increasing breathing effort. No accessory muscle use.  Cardiovascular: Regular rate and rhythm, no murmurs / rubs / gallops. No extremity edema. 2+ pedal pulses. No carotid bruits.  Abdomen: no tenderness, no masses palpated. No hepatosplenomegaly. Bowel sounds positive.  Musculoskeletal: no clubbing / cyanosis. No joint deformity upper and lower extremities. Good ROM, no contractures. Normal muscle tone.  Skin: no rashes, lesions, ulcers. No induration Neurologic: CN 2-12 grossly intact. Sensation intact, DTR normal. Strength 5/5 in all 4.  Psychiatric: Normal judgment and insight. Alert and oriented x 3. Normal mood.     Labs on Admission: I have personally reviewed following labs and imaging studies  CBC: Recent Labs  Lab 11/06/21 0710  WBC 6.1  HGB 14.3  HCT 43.2  MCV 82.1  PLT 103*   Basic Metabolic Panel: Recent Labs  Lab 11/06/21 0710  NA 140  K 3.6  CL 107  CO2 26  GLUCOSE 112*  BUN 16  CREATININE 1.12  CALCIUM 8.9   GFR: CrCl cannot be calculated (Unknown ideal weight.). Liver Function Tests: No results for input(s): "AST", "ALT", "ALKPHOS", "BILITOT", "PROT", "ALBUMIN" in the last 168 hours. No results for input(s): "LIPASE", "AMYLASE" in the last 168 hours. No results for input(s): "AMMONIA" in the last 168  hours. Coagulation Profile: No results for input(s): "INR", "PROTIME" in the last 168 hours. Cardiac Enzymes: No results for input(s): "CKTOTAL", "CKMB", "CKMBINDEX", "TROPONINI" in the last 168 hours. BNP (last 3 results) No results for input(s): "PROBNP" in the last 8760 hours. HbA1C: No results for input(s): "HGBA1C" in the last 72 hours. CBG: No results for input(s): "GLUCAP" in the last 168 hours. Lipid Profile: No results for input(s): "CHOL", "HDL", "LDLCALC", "TRIG", "CHOLHDL", "LDLDIRECT" in the last 72 hours. Thyroid Function Tests: Recent Labs    11/06/21 1230  TSH 1.410   Anemia Panel: No results for input(s): "VITAMINB12", "FOLATE", "FERRITIN", "TIBC", "IRON", "RETICCTPCT" in the last 72 hours. Urine analysis: No results found for: "COLORURINE", "APPEARANCEUR", "LABSPEC", "PHURINE", "GLUCOSEU", "HGBUR", "BILIRUBINUR", "KETONESUR", "PROTEINUR", "UROBILINOGEN", "NITRITE", "LEUKOCYTESUR"  Radiological Exams on Admission: CT Angio Chest PE W/Cm &/  Or Wo Cm  Result Date: 11/06/2021 CLINICAL DATA:  Left chest pain, positive D-dimer EXAM: CT ANGIOGRAPHY CHEST WITH CONTRAST TECHNIQUE: Multidetector CT imaging of the chest was performed using the standard protocol during bolus administration of intravenous contrast. Multiplanar CT image reconstructions and MIPs were obtained to evaluate the vascular anatomy. RADIATION DOSE REDUCTION: This exam was performed according to the departmental dose-optimization program which includes automated exposure control, adjustment of the mA and/or kV according to patient size and/or use of iterative reconstruction technique. CONTRAST:  53mL OMNIPAQUE IOHEXOL 350 MG/ML SOLN COMPARISON:  Chest radiograph done earlier today FINDINGS: Cardiovascular: Heart is enlarged in size. There is reflux of contrast into hepatic veins suggesting tricuspid incompetence. There are no intraluminal filling defects in central pulmonary artery branches. Evaluation of small  subsegmental peripheral branches is limited by motion artifacts. There is no contrast enhancement in thoracic aorta and the lumen of thoracic aorta is not evaluated. Mediastinum/Nodes: There are slightly enlarged lymph nodes in mediastinum and hilar regions, possibly suggesting reactive hyperplasia. Thyroid appears smaller than usual in size. Lungs/Pleura: Moderate to large bilateral pleural effusions are seen, more so on the right side. There is mild thickening of interlobular septi. There are faint ground-glass densities in parahilar regions and lower lung fields. There is no focal pulmonary consolidation. There is no pneumothorax. Upper Abdomen: No acute findings are seen. Musculoskeletal: Unremarkable. Review of the MIP images confirms the above findings. IMPRESSION: There is no evidence of pulmonary artery embolism.  Cardiomegaly. Moderate to large bilateral pleural effusions, more so on the right side. Subtle increased markings in both lungs suggest possible pulmonary edema. There is no focal pulmonary consolidation. Electronically Signed   By: Ernie Avena M.D.   On: 11/06/2021 15:01   DG Chest 2 View  Result Date: 11/06/2021 CLINICAL DATA:  56 year old male with history of anterior chest pain for the past 3 days during breathing. EXAM: CHEST - 2 VIEW COMPARISON:  Chest x-ray 04/13/2017. FINDINGS: There is cephalization of the pulmonary vasculature and slight indistinctness of the interstitial markings suggestive of mild pulmonary edema. Trace bilateral pleural effusions. No pneumothorax. Mild cardiomegaly. Upper mediastinal contours are within normal limits. IMPRESSION: 1. The appearance of the chest is most suggestive of mild congestive heart failure, as above. Electronically Signed   By: Trudie Reed M.D.   On: 11/06/2021 07:39    EKG: Independently reviewed.  Sinus, T wave inversions on V5 and V6  Assessment/Plan Principal Problem:   CHF (congestive heart failure) (HCC) Active  Problems:   Acute on chronic diastolic CHF (congestive heart failure) (HCC)  (please populate well all problems here in Problem List. (For example, if patient is on BP meds at home and you resume or decide to hold them, it is a problem that needs to be her. Same for CAD, COPD, HLD and so on)  Acute/subacute CHF decompensation -Secondary to HTN emergency from noncompliance with BP meds -Start diuresis Lasix 40 mg IV twice daily -Resume home BP meds, start losartan 100 mg daily, and Coreg 3.125 twice daily -Echocardiogram -Bilateral pleural effusion mild-moderate, expect improvement with IV diuresis, hold off Thoracentesis. -Other Ddx, PE ruled out. -Risk factor modification, check A1c and lipid panel, UDS, start aspirin.  Elevated troponins -Troponin levels 46> 42, EKG showed ST depression and T wave inversion on V5 and V6 -Ordered a repeat EKG since his chest pain is gone now, and went through the case with on call cardiology Dr. Antoine Poche and [facility] opinion that the EKG changes ST-K[facility]  5 6 likely repolarization from LVH, along with the flat patterned trop level, not compatible with ACS. -Add aspirin  HTN emergency -Resolved, resume home BP meds as above.  DVT prophylaxis: Lovenox Code Status: Full code Family Communication: None at bedside Disposition Plan: Patient sick with CHF decompensation bilateral pleural effusion, requiring inpatient work-up and treatment with IV diuresis, expect more than 2 midnight hospital stay. Consults called: None Admission status: Tele admit   Emeline General MD [facility] Pager [phone]  11/06/2021, 3:30 PM

## 2021-11-06 NOTE — ED Notes (Signed)
Received verbal report from Adam B RN at this time 

## 2021-11-06 NOTE — ED Provider Notes (Signed)
[facility] EMERGENCY DEPARTMENT Provider Note   CSN: [id number] Arrival date & time: 11/06/21  6440     History  Chief Complaint  Patient presents with   Chest Pain    John Velasquez is a 56 y.o. male.   Chest Pain  Patient is a 56 year old male with past medical history significant for untreated hypertension presented emergency room today with complaints of chest pain that is central and more of a pressure.  He states it is nonradiating does not seem to be exertional he states that has been present since midnight last night was approximately 12 hours at this time.  He denies any nausea or vomiting.  He states he does not feel particularly short of breath at this moment but sometimes does feel somewhat short of breath.  He states the chest pressure/discomfort is manageable and not severe.  No recent surgeries, hospitalization, long travel, hemoptysis, estrogen containing OCP, cancer history.  No unilateral leg swelling.  No history of PE or VTE.      Home Medications Prior to Admission medications   Medication Sig Start Date End Date Taking? Authorizing Provider  acetaminophen (TYLENOL) 500 MG tablet Take 1 tablet (500 mg total) by mouth every 6 (six) hours as needed. Patient taking differently: Take 500 mg by mouth every 6 (six) hours as needed for mild pain.  01/02/14   Cartner, Sharlet Salina, PA-C  cyclobenzaprine (FLEXERIL) 10 MG tablet Take 1 tablet (10 mg total) by mouth 2 (two) times daily as needed for muscle spasms. 05/14/21   Teressa Lower, PA-C  diphenhydrAMINE (BENADRYL) 25 mg capsule Take 1 capsule (25 mg total) by mouth every 6 (six) hours as needed for itching. Over the counter 04/15/17   Rai, Delene Ruffini, MD  fluticasone (FLONASE) 50 MCG/ACT nasal spray Place 2 sprays into both nostrils daily. 03/04/18   Couture, Cortni S, PA-C  hydrochlorothiazide (MICROZIDE) 12.5 MG capsule Take 1 capsule (12.5 mg total) by mouth daily. 04/15/17   Rai, Ripudeep K, MD   ibuprofen (ADVIL) 800 MG tablet Take 1 tablet (800 mg total) by mouth 3 (three) times daily. 05/14/21   Honor Loh M, PA-C  losartan (COZAAR) 100 MG tablet Take 1 tablet (100 mg total) by mouth daily. 04/15/17   Rai, Delene Ruffini, MD  nystatin (MYCOSTATIN/NYSTOP) powder Apply topically 3 (three) times daily. Apply to feet, hands 04/15/17   Rai, Delene Ruffini, MD      Allergies    Patient has no known allergies.    Review of Systems   Review of Systems  Cardiovascular:  Positive for chest pain.    Physical Exam Updated Vital Signs BP (!) 158/112   Pulse 87   Temp 98 F (36.7 C) (Oral)   Resp 19   SpO2 97%  Physical Exam Vitals and nursing note reviewed.  Constitutional:      General: He is not in acute distress. HENT:     Head: Normocephalic and atraumatic.     Nose: Nose normal.     Mouth/Throat:     Mouth: Mucous membranes are moist.  Eyes:     General: No scleral icterus. Cardiovascular:     Rate and Rhythm: Normal rate and regular rhythm.     Pulses: Normal pulses.     Heart sounds: Normal heart sounds.  Pulmonary:     Effort: Pulmonary effort is normal. No respiratory distress.     Breath sounds: Rales present. No wheezing.     Comments: BL bases  with crackles Abdominal:     Palpations: Abdomen is soft.     Tenderness: There is no abdominal tenderness. There is no guarding or rebound.  Musculoskeletal:     Cervical back: Normal range of motion.     Right lower leg: No edema.     Left lower leg: No edema.  Skin:    General: Skin is warm and dry.     Capillary Refill: Capillary refill takes less than 2 seconds.  Neurological:     Mental Status: He is alert. Mental status is at baseline.  Psychiatric:        Mood and Affect: Mood normal.        Behavior: Behavior normal.     ED Results / Procedures / Treatments   Labs (all labs ordered are listed, but only abnormal results are displayed) Labs Reviewed  BASIC METABOLIC PANEL - Abnormal; Notable for the  following components:      Result Value   Glucose, Bld 112 (*)    All other components within normal limits  CBC - Abnormal; Notable for the following components:   Platelets 103 (*)    All other components within normal limits  TROPONIN I (HIGH SENSITIVITY) - Abnormal; Notable for the following components:   Troponin I (High Sensitivity) 46 (*)    All other components within normal limits  BRAIN NATRIURETIC PEPTIDE  D-DIMER, QUANTITATIVE  TSH  TROPONIN I (HIGH SENSITIVITY)    EKG None  Radiology DG Chest 2 View  Result Date: 11/06/2021 CLINICAL DATA:  56 year old male with history of anterior chest pain for the past 3 days during breathing. EXAM: CHEST - 2 VIEW COMPARISON:  Chest x-ray 04/13/2017. FINDINGS: There is cephalization of the pulmonary vasculature and slight indistinctness of the interstitial markings suggestive of mild pulmonary edema. Trace bilateral pleural effusions. No pneumothorax. Mild cardiomegaly. Upper mediastinal contours are within normal limits. IMPRESSION: 1. The appearance of the chest is most suggestive of mild congestive heart failure, as above. Electronically Signed   By: Vinnie Langton M.D.   On: 11/06/2021 07:39    Procedures Procedures    Medications Ordered in ED Medications - No data to display  ED Course/ Medical Decision Making/ A&P                           Medical Decision Making Amount and/or Complexity of Data Reviewed Labs: ordered. Radiology: ordered.  Risk Prescription drug management. Decision regarding hospitalization.   This patient presents to the ED for concern of chest pain and shortness of breath, this involves a number of treatment options, and is a complaint that carries with it a high risk of complications and morbidity.  The differential diagnosis includes The emergent causes of chest pain include: Acute coronary syndrome, tamponade, pericarditis/myocarditis, aortic dissection, pulmonary embolism, tension  pneumothorax, pneumonia, and esophageal rupture.    Co morbidities: Discussed in HPI   Brief History:  Patient is a 56 year old male with past medical history significant for untreated hypertension presented emergency room today with complaints of chest pain that is central and more of a pressure.  He states it is nonradiating does not seem to be exertional he states that has been present since midnight last night was approximately 12 hours at this time.  He denies any nausea or vomiting.  He states he does not feel particularly short of breath at this moment but sometimes does feel somewhat short of breath.  He states the  chest pressure/discomfort is manageable and not severe.  No recent surgeries, hospitalization, long travel, hemoptysis, estrogen containing OCP, cancer history.  No unilateral leg swelling.  No history of PE or VTE.   No significant lower extremity edema.  Patient does have crackles in bilateral lung bases.  EMR reviewed including pt PMHx, past surgical history and past visits to ER.   See HPI for more details   Lab Tests:   I ordered and independently interpreted labs. Labs notable for    Imaging Studies:  Abnormal findings. I personally reviewed all imaging studies. Imaging notable for pulmonary edema consistent with CHF  CT negative for PE.  IMPRESSION:  There is no evidence of pulmonary artery embolism.  Cardiomegaly.    Moderate to large bilateral pleural effusions, more so on the right  side. Subtle increased markings in both lungs suggest possible  pulmonary edema. There is no focal pulmonary consolidation.    Cardiac Monitoring:  The patient was maintained on a cardiac monitor.  I personally viewed and interpreted the cardiac monitored which showed an underlying rhythm of: NSR EKG non-ischemic evidence of LVH which is new from prior.    Medicines ordered:  I ordered medication including nitroglycerin for chest pain Reevaluation of the  patient after these medicines showed that the patient improved I have reviewed the patients home medicines and have made adjustments as needed   Critical Interventions:     Consults/Attending Physician   I requested consultation with Dr. Chipper Herb,  and discussed lab and imaging findings as well as pertinent plan - they recommend: admission  I discussed this case with my attending physician who cosigned this note including patient's presenting symptoms, physical exam, and planned diagnostics and interventions. Attending physician stated agreement with plan or made changes to plan which were implemented.    Reevaluation:  After the interventions noted above I re-evaluated patient and found that they have :resolved   Social Determinants of Health:      Problem List / ED Course:  CHF -this is a new diagnosis for this patient.  He has had some chest pain which is improved with nitroglycerin but was persistent since midnight until nitroglycerin administration.  Troponins without significant delta.  BNP elevated consistent with CHF and pulmonary edema on chest x-ray.  Crackles in lungs.  No significant lower extremity edema and no significant JVP.   Dispostion:  After consideration of the diagnostic results and the patients response to treatment, I feel that the patent would benefit from admission    Final Clinical Impression(s) / ED Diagnoses Final diagnoses:  None    Rx / DC Orders ED Discharge Orders     None         Gailen Shelter, [place] 11/10/21 1322    Gwyneth Sprout, MD 11/10/21 1524

## 2021-11-07 ENCOUNTER — Telehealth (HOSPITAL_COMMUNITY): Payer: Self-pay | Admitting: Pharmacy Technician

## 2021-11-07 ENCOUNTER — Other Ambulatory Visit (HOSPITAL_COMMUNITY): Payer: Self-pay

## 2021-11-07 ENCOUNTER — Inpatient Hospital Stay (HOSPITAL_COMMUNITY): Payer: BC Managed Care – PPO

## 2021-11-07 ENCOUNTER — Encounter (HOSPITAL_COMMUNITY): Payer: Self-pay | Admitting: Internal Medicine

## 2021-11-07 DIAGNOSIS — I5021 Acute systolic (congestive) heart failure: Secondary | ICD-10-CM

## 2021-11-07 LAB — LIPID PANEL
Cholesterol: 169 mg/dL (ref 0–200)
HDL: 46 mg/dL (ref >40)
LDL Cholesterol: 106 mg/dL — ABNORMAL HIGH (ref 0–99)
Total CHOL/HDL Ratio: 3.7 RATIO
Triglycerides: 84 mg/dL (ref <150)
VLDL: 17 mg/dL (ref 0–40)

## 2021-11-07 LAB — HIV ANTIBODY (ROUTINE TESTING W REFLEX): HIV Screen 4th Generation wRfx: NONREACTIVE

## 2021-11-07 LAB — ECHOCARDIOGRAM COMPLETE
AR max vel: 3.48 cm2
AV Area VTI: 3.37 cm2
AV Area mean vel: 3.16 cm2
AV Mean grad: 1 mmHg
AV Peak grad: 2.5 mmHg
Ao pk vel: 0.8 m/s
Calc EF: 20 %
Height: 72 in
S' Lateral: 5.1 cm
Single Plane A2C EF: 25.7 %
Single Plane A4C EF: 20.1 %
Weight: 3200 oz

## 2021-11-07 LAB — BASIC METABOLIC PANEL
Anion gap: 9 (ref 5–15)
BUN: 15 mg/dL (ref 6–20)
CO2: 23 mmol/L (ref 22–32)
Calcium: 8.7 mg/dL — ABNORMAL LOW (ref 8.9–10.3)
Chloride: 107 mmol/L (ref 98–111)
Creatinine, Ser: 1.12 mg/dL (ref 0.61–1.24)
GFR, Estimated: >60 mL/min (ref >60)
Glucose, Bld: 85 mg/dL (ref 70–99)
Potassium: 3.9 mmol/L (ref 3.5–5.1)
Sodium: 139 mmol/L (ref 135–145)

## 2021-11-07 LAB — RAPID URINE DRUG SCREEN, HOSP PERFORMED
Amphetamines: NOT DETECTED
Barbiturates: NOT DETECTED
Benzodiazepines: NOT DETECTED
Cocaine: NOT DETECTED
Opiates: NOT DETECTED
Tetrahydrocannabinol: NOT DETECTED

## 2021-11-07 LAB — HEMOGLOBIN A1C
Hgb A1c MFr Bld: 5.7 % — ABNORMAL HIGH (ref 4.8–5.6)
Mean Plasma Glucose: 116.89 mg/dL

## 2021-11-07 LAB — TSH: TSH: 2.694 u[IU]/mL (ref 0.350–4.500)

## 2021-11-07 LAB — TROPONIN I (HIGH SENSITIVITY): Troponin I (High Sensitivity): 51 ng/L — ABNORMAL HIGH (ref <18)

## 2021-11-07 MED ORDER — SPIRONOLACTONE 25 MG PO TABS
25.0000 mg | ORAL_TABLET | Freq: Every day | ORAL | Status: DC
  Administered 2021-11-07 – 2021-11-09 (×3): 25 mg via ORAL
  Filled 2021-11-07 (×3): qty 1

## 2021-11-07 MED ORDER — SODIUM CHLORIDE 0.9 % IV SOLN: 250.0000 mL | INTRAVENOUS | Status: DC | PRN

## 2021-11-07 MED ORDER — SACUBITRIL-VALSARTAN 97-103 MG PO TABS
1.0000 | ORAL_TABLET | Freq: Two times a day (BID) | ORAL | Status: DC
  Administered 2021-11-07 – 2021-11-09 (×4): 1 via ORAL
  Filled 2021-11-07 (×5): qty 1

## 2021-11-07 MED ORDER — ATORVASTATIN CALCIUM 40 MG PO TABS
40.0000 mg | ORAL_TABLET | Freq: Every day | ORAL | Status: DC
  Administered 2021-11-07 – 2021-11-09 (×3): 40 mg via ORAL
  Filled 2021-11-07 (×3): qty 1

## 2021-11-07 MED ORDER — SODIUM CHLORIDE 0.9% FLUSH
3.0000 mL | Freq: Two times a day (BID) | INTRAVENOUS | Status: DC
  Administered 2021-11-08 – 2021-11-09 (×2): 3 mL via INTRAVENOUS

## 2021-11-07 MED ORDER — ORAL CARE MOUTH RINSE: 15.0000 mL | OROMUCOSAL | Status: DC | PRN

## 2021-11-07 MED ORDER — SODIUM CHLORIDE 0.9 % IV SOLN: INTRAVENOUS | Status: DC

## 2021-11-07 MED ORDER — ASPIRIN 81 MG PO CHEW
81.0000 mg | CHEWABLE_TABLET | ORAL | Status: AC
  Administered 2021-11-08: 81 mg via ORAL
  Filled 2021-11-07: qty 1

## 2021-11-07 MED ORDER — SODIUM CHLORIDE 0.9% FLUSH: 3.0000 mL | INTRAVENOUS | Status: DC | PRN

## 2021-11-07 NOTE — TOC Benefit Eligibility Note (Signed)
Patient Teacher, English as a foreign language completed.    The patient is currently admitted and upon discharge could be taking Entresto 24-26 mg.  The current 30 day co-pay is $20.00.   The patient is currently admitted and upon discharge could be taking Farxiga 20 mg.  The current 30 day co-pay is $20.00.  The patient is currently admitted and upon discharge could be taking Jardiance 10 mg.  The current 30 day co-pay is $20.00.    The patient is insured through [place], [place] Patient Advocate Specialist [address] Patient Advocate Team Direct Number: [phone]  Fax: [phone]

## 2021-11-07 NOTE — H&P (View-Only) (Signed)
  Advanced Heart Failure Team Consult Note   Primary Physician: Polite, Ronald, MD PCP-Cardiologist:  None  Reason for Consultation: Acute biventricular systolic heart failure  HPI:    John Velasquez is seen today for evaluation of acute biventricular systolic heart failure at the request of Dr. Wouk, Internal medicine  John Velasquez is a 56y.o. AA male with PMH of hypertension and tobacco abuse. Stopped meds 3 years ago and never followed up with PCP.   John Velasquez presented to ED for 2 weeks of SOB +orthopnea which recently worsened over the last week. Also experienced transient CP which seemed to resolve with ambulation. He recently called EMS last weekend for orthopnea and severe SOB, was advised to come to ED but symptoms mostly resolved so he stayed home. This weekend he had worsening chest pressure and persistent SOB so he decided to come to the ED. He had non-radiating CP that resolves with activity. Describes it as tightness/pressure. 3 years ago was on meds for HTN but stopped them and had no f/ups because he felt good and was concerned about long term med effects. No cardiac family history. Significant SOB climbing stairs. Has GF that's a HH nurse in [place]. Smoking 1 PPD for 39yrs up until Saturday. Rarely has 1 beer. No illicit drug use. Works in box printing. Denies snoring.   In ED received SL nitro x1 with significant improvement. Initial BP 160s/120s. CT angiogram negative for PE but bilateral pulmonary congestion and bilateral pleural effusion right> left. Diuresed with IV lasix. D-dimer (+). CT (-) for PE.  Echo done showed EF at 20-25% and GIIDD. AHF team consulted for further workup.   In chair, no acute distress or CP. No SOB at rest. Seems very motivated to quit smoking and be compliant with all medications moving forward.   Echo EF 20-25%, LV global hypokinesis,  GIIDD, Normal RV. LA severely dilated. Mild MR and TR.    Home Medications Prior to Admission  medications   Medication Sig Start Date End Date Taking? Authorizing Provider  cyclobenzaprine (FLEXERIL) 10 MG tablet Take 1 tablet (10 mg total) by mouth 2 (two) times daily as needed for muscle spasms. Patient not taking: Reported on 11/06/2021 05/14/21   Fleming, Conner M, PA-C  ibuprofen (ADVIL) 800 MG tablet Take 1 tablet (800 mg total) by mouth 3 (three) times daily. Patient not taking: Reported on 11/06/2021 05/14/21   Fleming, Conner M, PA-C    Past Medical History: Past Medical History:  Diagnosis Date   Cellulitis 04/13/2017   BILATERAL FEET   Hypertension     Past Surgical History: Past Surgical History:  Procedure Laterality Date   TONSILLECTOMY      Family History: Family History  Problem Relation Age of Onset   Cancer Maternal Grandmother        unknown   Cancer Paternal Grandmother        unknown   Heart disease Mother    Hypertension Mother    Cancer Father        throat   Alcohol abuse Father     Social History: Social History   Socioeconomic History   Marital status: Divorced    Spouse name: Not on file   Number of children: 1   Years of education: Not on file   Highest education level: High school graduate  Occupational History   Occupation: Manufacuturing    Comment: operator of printing machine  Tobacco Use   Smoking status: Former      Packs/day: 1.00    Types: Cigarettes    Start date: 01/30/1982    Quit date: 01/30/2021    Years since quitting: 0.7   Smokeless tobacco: Never  Vaping Use   Vaping Use: Never used  Substance and Sexual Activity   Alcohol use: No   Drug use: No   Sexual activity: Yes    Partners: Female    Birth control/protection: Condom  Other Topics Concern   Not on file  Social History Narrative   Not on file   Social Determinants of Health   Financial Resource Strain: Low Risk  (11/07/2021)   Overall Financial Resource Strain (CARDIA)    Difficulty of Paying Living Expenses: Not very hard  Food Insecurity: No  Food Insecurity (11/07/2021)   Hunger Vital Sign    Worried About Running Out of Food in the Last Year: Never true    Ran Out of Food in the Last Year: Never true  Transportation Needs: No Transportation Needs (11/07/2021)   PRAPARE - Transportation    Lack of Transportation (Medical): No    Lack of Transportation (Non-Medical): No  Physical Activity: Not on file  Stress: No Stress Concern Present (11/07/2021)   [employer] - Occupational Stress Questionnaire    Feeling of Stress : Only a little  Social Connections: Not on file    Allergies:  Allergies  Allergen Reactions   Penicillins Other (See Comments)    Unknown reaction    Objective:    Vital Signs:   Temp:  [97.6 F (36.4 C)-98.5 F (36.9 C)] 97.6 F (36.4 C) (10/09 1258) Pulse Rate:  [50-95] 90 (10/09 1258) Resp:  [14-29] 18 (10/09 1258) BP: (121-152)/(97-132) 137/102 (10/09 1258) SpO2:  [93 %-98 %] 93 % (10/09 0909) Weight:  [90.7 kg] 90.7 kg (10/09 0102) Last BM Date : 11/06/21  Weight change: Filed Weights   11/07/21 0102  Weight: 90.7 kg    Intake/Output:   Intake/Output Summary (Last 24 hours) at 11/07/2021 1439 Last data filed at 11/07/2021 1300 Gross per 24 hour  Intake 480 ml  Output 1900 ml  Net -1420 ml      Physical Exam    General:  well appearing.  No respiratory difficulty HEENT: normal Neck: supple. JVD ~10 cm. Carotids 2+ bilat; no bruits. No lymphadenopathy or thyromegaly appreciated. Cor: PMI nondisplaced. Regular rate & rhythm. No rubs, gallops or murmurs. Lungs: clear, diminished bases Abdomen: soft, nontender, nondistended. No hepatosplenomegaly. No bruits or masses. Good bowel sounds. Extremities: no cyanosis, clubbing, rash, edema  Neuro: alert & oriented x 3, cranial nerves grossly intact. moves all 4 extremities w/o difficulty. Affect pleasant.   Telemetry   NSR 90s (Personally reviewed)    EKG    Sinus rhythm w/ premature supraventricular  complexes, BAE, LVH 99 bpm  Labs   Basic Metabolic Panel: Recent Labs  Lab 11/06/21 0710 11/07/21 0320  NA 140 139  K 3.6 3.9  CL 107 107  CO2 26 23  GLUCOSE 112* 85  BUN 16 15  CREATININE 1.12 1.12  CALCIUM 8.9 8.7*    Liver Function Tests: No results for input(s): "AST", "ALT", "ALKPHOS", "BILITOT", "PROT", "ALBUMIN" in the last 168 hours. No results for input(s): "LIPASE", "AMYLASE" in the last 168 hours. No results for input(s): "AMMONIA" in the last 168 hours.  CBC: Recent Labs  Lab 11/06/21 0710  WBC 6.1  HGB 14.3  HCT 43.2  MCV 82.1  PLT 103*    Cardiac Enzymes: No   results for input(s): "CKTOTAL", "CKMB", "CKMBINDEX", "TROPONINI" in the last 168 hours.  BNP: BNP (last 3 results) Recent Labs    11/06/21 0710  BNP 717.3*    ProBNP (last 3 results) No results for input(s): "PROBNP" in the last 8760 hours.   CBG: No results for input(s): "GLUCAP" in the last 168 hours.  Coagulation Studies: No results for input(s): "LABPROT", "INR" in the last 72 hours.   Imaging   ECHOCARDIOGRAM COMPLETE  Result Date: 11/07/2021    ECHOCARDIOGRAM REPORT   Patient Name:   John Velasquez Date of Exam: 11/07/2021 Medical Rec #:  [id number]       Height:       72.0 in Accession #:    [id number]      Weight:       200.0 lb Date of Birth:  Apr 29, 1965       BSA:          2.131 m Patient Age:    69 years        BP:           137/107 mmHg Patient Gender: M               HR:           90 bpm. Exam Location:  Inpatient Procedure: 2D Echo, Cardiac Doppler and Color Doppler Indications:    CHF  History:        Patient has no prior history of Echocardiogram examinations.                 Risk Factors:Hypertension.  Sonographer:    Memory Argue Referring Phys: [id number] [place]  1. Left ventricular ejection fraction, by estimation, is 20 to 25%. The left ventricle has severely decreased function. The left ventricle demonstrates global hypokinesis. Left ventricular  diastolic parameters are consistent with Grade II diastolic dysfunction (pseudonormalization).  2. Right ventricular systolic function is normal. The right ventricular size is normal.  3. Left atrial size was severely dilated.  4. Moderate pleural effusion in the left lateral region.  5. The mitral valve is normal in structure. Mild mitral valve regurgitation. No evidence of mitral stenosis.  6. The aortic valve is tricuspid. There is mild calcification of the aortic valve. There is mild thickening of the aortic valve. Aortic valve regurgitation is not visualized. Aortic valve sclerosis is present, with no evidence of aortic valve stenosis.  7. The inferior vena cava is normal in size with greater than 50% respiratory variability, suggesting right atrial pressure of 3 mmHg. FINDINGS  Left Ventricle: Left ventricular ejection fraction, by estimation, is 20 to 25%. The left ventricle has severely decreased function. The left ventricle demonstrates global hypokinesis. The left ventricular internal cavity size was normal in size. There is no left ventricular hypertrophy. Left ventricular diastolic parameters are consistent with Grade II diastolic dysfunction (pseudonormalization). Right Ventricle: The right ventricular size is normal. No increase in right ventricular wall thickness. Right ventricular systolic function is normal. Left Atrium: Left atrial size was severely dilated. Right Atrium: Right atrial size was normal in size. Pericardium: There is no evidence of pericardial effusion. Mitral Valve: The mitral valve is normal in structure. Mild mitral valve regurgitation. No evidence of mitral valve stenosis. Tricuspid Valve: The tricuspid valve is normal in structure. Tricuspid valve regurgitation is mild . No evidence of tricuspid stenosis. Aortic Valve: The aortic valve is tricuspid. There is mild calcification of the aortic valve. There is mild thickening of the  aortic valve. Aortic valve regurgitation is not  visualized. Aortic valve sclerosis is present, with no evidence of aortic valve stenosis. Aortic valve mean gradient measures 1.0 mmHg. Aortic valve peak gradient measures 2.5 mmHg. Aortic valve area, by VTI measures 3.37 cm. Pulmonic Valve: The pulmonic valve was normal in structure. Pulmonic valve regurgitation is mild. No evidence of pulmonic stenosis. Aorta: The aortic root is normal in size and structure. Venous: The inferior vena cava is normal in size with greater than 50% respiratory variability, suggesting right atrial pressure of 3 mmHg. IAS/Shunts: No atrial level shunt detected by color flow Doppler. Additional Comments: There is a moderate pleural effusion in the left lateral region.  LEFT VENTRICLE PLAX 2D LVIDd:         5.50 cm LVIDs:         5.10 cm LV PW:         1.10 cm LV IVS:        1.10 cm LVOT diam:     2.60 cm LV SV:         41 LV SV Index:   19 LVOT Area:     5.31 cm  LV Volumes (MOD) LV vol d, MOD A2C: 179.0 ml LV vol d, MOD A4C: 199.0 ml LV vol s, MOD A2C: 133.0 ml LV vol s, MOD A4C: 159.0 ml LV SV MOD A2C:     46.0 ml LV SV MOD A4C:     199.0 ml LV SV MOD BP:      37.8 ml RIGHT VENTRICLE TAPSE (M-mode): 1.7 cm LEFT ATRIUM              Index        RIGHT ATRIUM           Index LA diam:        4.20 cm  1.97 cm/m   RA Area:     15.50 cm LA Vol (A2C):   122.0 ml 57.26 ml/m  RA Volume:   34.90 ml  16.38 ml/m LA Vol (A4C):   116.0 ml 54.45 ml/m LA Biplane Vol: 120.0 ml 56.32 ml/m  AORTIC VALVE AV Area (Vmax):    3.48 cm AV Area (Vmean):   3.16 cm AV Area (VTI):     3.37 cm AV Vmax:           79.60 cm/s AV Vmean:          54.300 cm/s AV VTI:            0.121 m AV Peak Grad:      2.5 mmHg AV Mean Grad:      1.0 mmHg LVOT Vmax:         52.20 cm/s LVOT Vmean:        32.300 cm/s LVOT VTI:          0.077 m LVOT/AV VTI ratio: 0.64  AORTA Ao Root diam: 3.40 cm Ao Asc diam:  3.20 cm  SHUNTS Systemic VTI:  0.08 m Systemic Diam: 2.60 cm Mark Skains MD Electronically signed by Mark Skains MD  Signature Date/Time: 11/07/2021/10:20:02 AM    Final    CT Angio Chest PE W/Cm &/Or Wo Cm  Result Date: 11/06/2021 CLINICAL DATA:  Left chest pain, positive D-dimer EXAM: CT ANGIOGRAPHY CHEST WITH CONTRAST TECHNIQUE: Multidetector CT imaging of the chest was performed using the standard protocol during bolus administration of intravenous contrast. Multiplanar CT image reconstructions and MIPs were obtained to evaluate the vascular anatomy. RADIATION DOSE REDUCTION: This   exam was performed according to the departmental dose-optimization program which includes automated exposure control, adjustment of the mA and/or kV according to patient size and/or use of iterative reconstruction technique. CONTRAST:  80mL OMNIPAQUE IOHEXOL 350 MG/ML SOLN COMPARISON:  Chest radiograph done earlier today FINDINGS: Cardiovascular: Heart is enlarged in size. There is reflux of contrast into hepatic veins suggesting tricuspid incompetence. There are no intraluminal filling defects in central pulmonary artery branches. Evaluation of small subsegmental peripheral branches is limited by motion artifacts. There is no contrast enhancement in thoracic aorta and the lumen of thoracic aorta is not evaluated. Mediastinum/Nodes: There are slightly enlarged lymph nodes in mediastinum and hilar regions, possibly suggesting reactive hyperplasia. Thyroid appears smaller than usual in size. Lungs/Pleura: Moderate to large bilateral pleural effusions are seen, more so on the right side. There is mild thickening of interlobular septi. There are faint ground-glass densities in parahilar regions and lower lung fields. There is no focal pulmonary consolidation. There is no pneumothorax. Upper Abdomen: No acute findings are seen. Musculoskeletal: Unremarkable. Review of the MIP images confirms the above findings. IMPRESSION: There is no evidence of pulmonary artery embolism.  Cardiomegaly. Moderate to large bilateral pleural effusions, more so on the  right side. Subtle increased markings in both lungs suggest possible pulmonary edema. There is no focal pulmonary consolidation. Electronically Signed   By: Palani  Rathinasamy M.D.   On: 11/06/2021 15:01     Medications:     Current Medications:  atorvastatin  40 mg Oral Daily   carvedilol  3.125 mg Oral BID WC   enoxaparin (LOVENOX) injection  40 mg Subcutaneous Q24H   fluticasone  2 spray Each Nare Daily   furosemide  40 mg Intravenous BID   losartan  100 mg Oral Daily   sodium chloride flush  3 mL Intravenous Q12H    Infusions:  sodium chloride        Patient Profile   John Velasquez is a 56y.o AA male with PMH of HTN and tobacco abuse. Presented to ED with SOB, orthopnea and CP. Echo revealed acute biventricular systolic heart failure w/ EF 20-25%.. AHF team asked to see for further workup.   Assessment/Plan   Acute biventricular systolic heart failure - Echo EF 20-25%, LV global hypokinesis,  GIIDD, Normal RV. LA severely dilated. Mild MR and TR.  - Presented NYHA class III-IIIb, BNP slightly elevated at 717, CTA (-) for PE but bilateral pulmonary congestion and bilateral pleural effusion R>L - Suspect CP/HF of ischemic nature w/ 39 year h/o smoking. Could also be 2/2 uncontrolled HTN.  - Currently on 40mg IV lasix BID. Will transition to PO diuretics tomorrow. Last dose tonight.  - Continue carvedilol 3.125mg BID, avoid up titraiting until cath complete.  - Plan L/RHC tomorrow - Start GDMT as tolerated - Switch losartan 100mg daily to Entresto 97/103 - A1c 5.7. Can eventually add SGLT2i - Start spiro 25 daily - Strict I&O, daily weights Chest pain - Hstrop elevated 46> 42>51, EKG showed ST depression and T wave inversion on V5 and V6 - Continue ASA - No CP at the moment Hypertension - w/ narrow-pulse pressure - Mgmt as above - Consider bidil if BP remains elevated Hyperlipidemia - LDL 106 goal <70 - Continue atrovastatin 40mg daily 5. Smoker - heavy smoker,  1PPD/39 yrs - Quit as of Saturday - Doesn't want patch for now, aware of option   Length of Stay: 1  John Velasquez, AGACNP-BC  11/07/2021, 2:39 PM  Advanced Heart Failure   Team Pager [phone] (M-F; 7a - 5p)  Please contact [facility] for night-coverage after hours (4p -7a ) and weekends on [email or web address]  

## 2021-11-07 NOTE — Telephone Encounter (Signed)
Pharmacy Patient Advocate Encounter  Insurance verification completed.    The patient is insured through [employer]   The patient is currently admitted and ran test claims for the following: John Velasquez, Jardiance.  Copays and coinsurance results were relayed to Inpatient clinical team.

## 2021-11-07 NOTE — ED Notes (Signed)
Verbal report given to Anna M EMT-P at this time 

## 2021-11-07 NOTE — Consult Note (Addendum)
Advanced Heart Failure Team Consult Note   Primary Physician: Seward Carol, MD PCP-Cardiologist:  None  Reason for Consultation: Acute biventricular systolic heart failure  HPI:    John Velasquez is seen today for evaluation of acute biventricular systolic heart failure at the request of Dr. Si Raider, Internal medicine  John Velasquez is a 56y.o. AA male with PMH of hypertension and tobacco abuse. Stopped meds 3 years ago and never followed up with PCP.   John Velasquez presented to ED for 2 weeks of SOB +orthopnea which recently worsened over the last week. Also experienced transient CP which seemed to resolve with ambulation. He recently called EMS last weekend for orthopnea and severe SOB, was advised to come to ED but symptoms mostly resolved so he stayed home. This weekend he had worsening chest pressure and persistent SOB so he decided to come to the ED. He had non-radiating CP that resolves with activity. Describes it as tightness/pressure. 3 years ago was on meds for HTN but stopped them and had no f/ups because he felt good and was concerned about long term med effects. No cardiac family history. Significant SOB climbing stairs. Has GF that's a HH nurse in [place]. Smoking 1 PPD for 41yrs up until Saturday. Rarely has 1 beer. No illicit drug use. Works in [employer]. Denies snoring.   In ED received SL nitro x1 with significant improvement. Initial BP 160s/120s. CT angiogram negative for PE but bilateral pulmonary congestion and bilateral pleural effusion right> left. Diuresed with IV lasix. D-dimer (+). CT (-) for PE.  Echo done showed EF at 20-25% and GIIDD. AHF team consulted for further workup.   In chair, no acute distress or CP. No SOB at rest. Seems very motivated to quit smoking and be compliant with all medications moving forward.   Echo EF 20-25%, LV global hypokinesis,  GIIDD, Normal RV. LA severely dilated. Mild MR and TR.    Home Medications Prior to Admission  medications   Medication Sig Start Date End Date Taking? Authorizing Provider  cyclobenzaprine (FLEXERIL) 10 MG tablet Take 1 tablet (10 mg total) by mouth 2 (two) times daily as needed for muscle spasms. Patient not taking: Reported on 11/06/2021 05/14/21   Hendricks Limes, PA-C  ibuprofen (ADVIL) 800 MG tablet Take 1 tablet (800 mg total) by mouth 3 (three) times daily. Patient not taking: Reported on 11/06/2021 05/14/21   Hendricks Limes, PA-C    Past Medical History: Past Medical History:  Diagnosis Date   Cellulitis 04/13/2017   BILATERAL FEET   Hypertension     Past Surgical History: Past Surgical History:  Procedure Laterality Date   TONSILLECTOMY      Family History: Family History  Problem Relation Age of Onset   Cancer Maternal Grandmother        unknown   Cancer Paternal Grandmother        unknown   Heart disease Mother    Hypertension Mother    Cancer Father        throat   Alcohol abuse Father     Social History: Social History   Socioeconomic History   Marital status: Divorced    Spouse name: Not on file   Number of children: 1   Years of education: Not on file   Highest education level: High school graduate  Occupational History   Occupation: Manufacuturing    Comment: Air traffic controller  Tobacco Use   Smoking status: Former  Packs/day: 1.00    Types: Cigarettes    Start date: 01/30/1982    Quit date: 01/30/2021    Years since quitting: 0.7   Smokeless tobacco: Never  Vaping Use   Vaping Use: Never used  Substance and Sexual Activity   Alcohol use: No   Drug use: No   Sexual activity: Yes    Partners: Female    Birth control/protection: Condom  Other Topics Concern   Not on file  Social History Narrative   Not on file   Social Determinants of Health   Financial Resource Strain: Low Risk  (11/07/2021)   Overall Financial Resource Strain (CARDIA)    Difficulty of Paying Living Expenses: Not very hard  Food Insecurity: No  Food Insecurity (11/07/2021)   Hunger Vital Sign    Worried About Running Out of Food in the Last Year: Never true    Ran Out of Food in the Last Year: Never true  Transportation Needs: No Transportation Needs (11/07/2021)   PRAPARE - Hydrologist (Medical): No    Lack of Transportation (Non-Medical): No  Physical Activity: Not on file  Stress: No Stress Concern Present (11/07/2021)   [place]    Feeling of Stress : Only a little  Social Connections: Not on file    Allergies:  Allergies  Allergen Reactions   Penicillins Other (See Comments)    Unknown reaction    Objective:    Vital Signs:   Temp:  [97.6 F (36.4 C)-98.5 F (36.9 C)] 97.6 F (36.4 C) (10/09 1258) Pulse Rate:  [50-95] 90 (10/09 1258) Resp:  [14-29] 18 (10/09 1258) BP: (121-152)/(97-132) 137/102 (10/09 1258) SpO2:  [93 %-98 %] 93 % (10/09 0909) Weight:  [90.7 kg] 90.7 kg (10/09 0102) Last BM Date : 11/06/21  Weight change: Filed Weights   11/07/21 0102  Weight: 90.7 kg    Intake/Output:   Intake/Output Summary (Last 24 hours) at 11/07/2021 1439 Last data filed at 11/07/2021 1300 Gross per 24 hour  Intake 480 ml  Output 1900 ml  Net -1420 ml      Physical Exam    General:  well appearing.  No respiratory difficulty HEENT: normal Neck: supple. JVD ~10 cm. Carotids 2+ bilat; no bruits. No lymphadenopathy or thyromegaly appreciated. Cor: PMI nondisplaced. Regular rate & rhythm. No rubs, gallops or murmurs. Lungs: clear, diminished bases Abdomen: soft, nontender, nondistended. No hepatosplenomegaly. No bruits or masses. Good bowel sounds. Extremities: no cyanosis, clubbing, rash, edema  Neuro: alert & oriented x 3, cranial nerves grossly intact. moves all 4 extremities w/o difficulty. Affect pleasant.   Telemetry   NSR 90s (Personally reviewed)    EKG    Sinus rhythm w/ premature supraventricular  complexes, BAE, LVH 99 bpm  Labs   Basic Metabolic Panel: Recent Labs  Lab 11/06/21 0710 11/07/21 0320  NA 140 139  K 3.6 3.9  CL 107 107  CO2 26 23  GLUCOSE 112* 85  BUN 16 15  CREATININE 1.12 1.12  CALCIUM 8.9 8.7*    Liver Function Tests: No results for input(s): "AST", "ALT", "ALKPHOS", "BILITOT", "PROT", "ALBUMIN" in the last 168 hours. No results for input(s): "LIPASE", "AMYLASE" in the last 168 hours. No results for input(s): "AMMONIA" in the last 168 hours.  CBC: Recent Labs  Lab 11/06/21 0710  WBC 6.1  HGB 14.3  HCT 43.2  MCV 82.1  PLT 103*    Cardiac Enzymes: No  results for input(s): "CKTOTAL", "CKMB", "CKMBINDEX", "TROPONINI" in the last 168 hours.  BNP: BNP (last 3 results) Recent Labs    11/06/21 0710  BNP 717.3*    ProBNP (last 3 results) No results for input(s): "PROBNP" in the last 8760 hours.   CBG: No results for input(s): "GLUCAP" in the last 168 hours.  Coagulation Studies: No results for input(s): "LABPROT", "INR" in the last 72 hours.   Imaging   ECHOCARDIOGRAM COMPLETE  Result Date: 11/07/2021    ECHOCARDIOGRAM REPORT   Patient Name:   John Velasquez Date of Exam: 11/07/2021 Medical Rec #:  [id number]       Height:       72.0 in Accession #:    [id number]      Weight:       200.0 lb Date of Birth:  Apr 29, 1965       BSA:          2.131 m Patient Age:    69 years        BP:           137/107 mmHg Patient Gender: M               HR:           90 bpm. Exam Location:  Inpatient Procedure: 2D Echo, Cardiac Doppler and Color Doppler Indications:    CHF  History:        Patient has no prior history of Echocardiogram examinations.                 Risk Factors:Hypertension.  Sonographer:    Memory Argue Referring Phys: [id number] [place]  1. Left ventricular ejection fraction, by estimation, is 20 to 25%. The left ventricle has severely decreased function. The left ventricle demonstrates global hypokinesis. Left ventricular  diastolic parameters are consistent with Grade II diastolic dysfunction (pseudonormalization).  2. Right ventricular systolic function is normal. The right ventricular size is normal.  3. Left atrial size was severely dilated.  4. Moderate pleural effusion in the left lateral region.  5. The mitral valve is normal in structure. Mild mitral valve regurgitation. No evidence of mitral stenosis.  6. The aortic valve is tricuspid. There is mild calcification of the aortic valve. There is mild thickening of the aortic valve. Aortic valve regurgitation is not visualized. Aortic valve sclerosis is present, with no evidence of aortic valve stenosis.  7. The inferior vena cava is normal in size with greater than 50% respiratory variability, suggesting right atrial pressure of 3 mmHg. FINDINGS  Left Ventricle: Left ventricular ejection fraction, by estimation, is 20 to 25%. The left ventricle has severely decreased function. The left ventricle demonstrates global hypokinesis. The left ventricular internal cavity size was normal in size. There is no left ventricular hypertrophy. Left ventricular diastolic parameters are consistent with Grade II diastolic dysfunction (pseudonormalization). Right Ventricle: The right ventricular size is normal. No increase in right ventricular wall thickness. Right ventricular systolic function is normal. Left Atrium: Left atrial size was severely dilated. Right Atrium: Right atrial size was normal in size. Pericardium: There is no evidence of pericardial effusion. Mitral Valve: The mitral valve is normal in structure. Mild mitral valve regurgitation. No evidence of mitral valve stenosis. Tricuspid Valve: The tricuspid valve is normal in structure. Tricuspid valve regurgitation is mild . No evidence of tricuspid stenosis. Aortic Valve: The aortic valve is tricuspid. There is mild calcification of the aortic valve. There is mild thickening of the  aortic valve. Aortic valve regurgitation is not  visualized. Aortic valve sclerosis is present, with no evidence of aortic valve stenosis. Aortic valve mean gradient measures 1.0 mmHg. Aortic valve peak gradient measures 2.5 mmHg. Aortic valve area, by VTI measures 3.37 cm. Pulmonic Valve: The pulmonic valve was normal in structure. Pulmonic valve regurgitation is mild. No evidence of pulmonic stenosis. Aorta: The aortic root is normal in size and structure. Venous: The inferior vena cava is normal in size with greater than 50% respiratory variability, suggesting right atrial pressure of 3 mmHg. IAS/Shunts: No atrial level shunt detected by color flow Doppler. Additional Comments: There is a moderate pleural effusion in the left lateral region.  LEFT VENTRICLE PLAX 2D LVIDd:         5.50 cm LVIDs:         5.10 cm LV PW:         1.10 cm LV IVS:        1.10 cm LVOT diam:     2.60 cm LV SV:         41 LV SV Index:   19 LVOT Area:     5.31 cm  LV Volumes (MOD) LV vol d, MOD A2C: 179.0 ml LV vol d, MOD A4C: 199.0 ml LV vol s, MOD A2C: 133.0 ml LV vol s, MOD A4C: 159.0 ml LV SV MOD A2C:     46.0 ml LV SV MOD A4C:     199.0 ml LV SV MOD BP:      37.8 ml RIGHT VENTRICLE TAPSE (M-mode): 1.7 cm LEFT ATRIUM              Index        RIGHT ATRIUM           Index LA diam:        4.20 cm  1.97 cm/m   RA Area:     15.50 cm LA Vol (A2C):   122.0 ml 57.26 ml/m  RA Volume:   34.90 ml  16.38 ml/m LA Vol (A4C):   116.0 ml 54.45 ml/m LA Biplane Vol: 120.0 ml 56.32 ml/m  AORTIC VALVE AV Area (Vmax):    3.48 cm AV Area (Vmean):   3.16 cm AV Area (VTI):     3.37 cm AV Vmax:           79.60 cm/s AV Vmean:          54.300 cm/s AV VTI:            0.121 m AV Peak Grad:      2.5 mmHg AV Mean Grad:      1.0 mmHg LVOT Vmax:         52.20 cm/s LVOT Vmean:        32.300 cm/s LVOT VTI:          0.077 m LVOT/AV VTI ratio: 0.64  AORTA Ao Root diam: 3.40 cm Ao Asc diam:  3.20 cm  SHUNTS Systemic VTI:  0.08 m Systemic Diam: 2.60 cm Candee Furbish MD Electronically signed by Candee Furbish MD  Signature Date/Time: 11/07/2021/10:20:02 AM    Final    CT Angio Chest PE W/Cm &/Or Wo Cm  Result Date: 11/06/2021 CLINICAL DATA:  Left chest pain, positive D-dimer EXAM: CT ANGIOGRAPHY CHEST WITH CONTRAST TECHNIQUE: Multidetector CT imaging of the chest was performed using the standard protocol during bolus administration of intravenous contrast. Multiplanar CT image reconstructions and MIPs were obtained to evaluate the vascular anatomy. RADIATION DOSE REDUCTION: This  exam was performed according to the departmental dose-optimization program which includes automated exposure control, adjustment of the mA and/or kV according to patient size and/or use of iterative reconstruction technique. CONTRAST:  57mL OMNIPAQUE IOHEXOL 350 MG/ML SOLN COMPARISON:  Chest radiograph done earlier today FINDINGS: Cardiovascular: Heart is enlarged in size. There is reflux of contrast into hepatic veins suggesting tricuspid incompetence. There are no intraluminal filling defects in central pulmonary artery branches. Evaluation of small subsegmental peripheral branches is limited by motion artifacts. There is no contrast enhancement in thoracic aorta and the lumen of thoracic aorta is not evaluated. Mediastinum/Nodes: There are slightly enlarged lymph nodes in mediastinum and hilar regions, possibly suggesting reactive hyperplasia. Thyroid appears smaller than usual in size. Lungs/Pleura: Moderate to large bilateral pleural effusions are seen, more so on the right side. There is mild thickening of interlobular septi. There are faint ground-glass densities in parahilar regions and lower lung fields. There is no focal pulmonary consolidation. There is no pneumothorax. Upper Abdomen: No acute findings are seen. Musculoskeletal: Unremarkable. Review of the MIP images confirms the above findings. IMPRESSION: There is no evidence of pulmonary artery embolism.  Cardiomegaly. Moderate to large bilateral pleural effusions, more so on the  right side. Subtle increased markings in both lungs suggest possible pulmonary edema. There is no focal pulmonary consolidation. Electronically Signed   By: Elmer Picker M.D.   On: 11/06/2021 15:01     Medications:     Current Medications:  atorvastatin  40 mg Oral Daily   carvedilol  3.125 mg Oral BID WC   enoxaparin (LOVENOX) injection  40 mg Subcutaneous Q24H   fluticasone  2 spray Each Nare Daily   furosemide  40 mg Intravenous BID   losartan  100 mg Oral Daily   sodium chloride flush  3 mL Intravenous Q12H    Infusions:  sodium chloride        Patient Profile   Mr. Ury is a 56y.o AA male with PMH of HTN and tobacco abuse. Presented to ED with SOB, orthopnea and CP. Echo revealed acute biventricular systolic heart failure w/ EF 20-25%.. AHF team asked to see for further workup.   Assessment/Plan   Acute biventricular systolic heart failure - Echo EF 20-25%, LV global hypokinesis,  GIIDD, Normal RV. LA severely dilated. Mild MR and TR.  - Presented NYHA class III-IIIb, BNP slightly elevated at 717, CTA (-) for PE but bilateral pulmonary congestion and bilateral pleural effusion R>L - Suspect CP/HF of ischemic nature w/ 39 year h/o smoking. Could also be 2/2 uncontrolled HTN.  - Currently on 40mg  IV lasix BID. Will transition to PO diuretics tomorrow. Last dose tonight.  - Continue carvedilol 3.125mg  BID, avoid up titraiting until cath complete.  - Plan [facility] tomorrow - Start GDMT as tolerated - Switch losartan 100mg  daily to Entresto 97/103 - A1c 5.7. Can eventually add SGLT2i - Start spiro 25 daily - Strict I&O, daily weights Chest pain - Hstrop elevated 46> 42>51, EKG showed ST depression and T wave inversion on V5 and V6 - Continue ASA - No CP at the moment Hypertension - w/ narrow-pulse pressure - Mgmt as above - Consider bidil if BP remains elevated Hyperlipidemia - LDL 106 goal <70 - Continue atrovastatin 40mg  daily 5. Smoker - heavy smoker,  1PPD/39 yrs - Quit as of Saturday - Doesn't want patch for now, aware of option   Length of Stay: [place], AGACNP-BC  11/07/2021, 2:39 PM  Advanced Heart Failure  Team Pager [phone] (M-F; 7a - 5p)  Please contact [facility] for night-coverage after hours (4p -7a ) and weekends on [email or web address]

## 2021-11-07 NOTE — Progress Notes (Signed)
PROGRESS NOTE    CORAL SOLER  DTO:[id number] DOB: 09/30/1965 DOA: 11/06/2021 PCP: Seward Carol, MD  Outpatient Specialists: none    Brief Narrative:   From admission h and p ANIK WESCH is a 56 y.o. male with medical history significant of HTN, noncompliant with BP meds, presented with increasing shortness of breath.   Symptoms started 2 weeks ago, with new onset of exertional dyspnea.  Walking 5 minutes trigger significant shortness of breath and had to stop and to catch his breath.  No cough no fever chills no chest pains.  6 days ago, patient developed orthopnea, and could not sleep overnight call EMS in the morning, EMS arrived and found patient blood pressure slightly elevated and recommended patient come to the hospital however patient deferred and stay at home.  Last night, patient developed severe orthopnea again and pressure-like chest pain, 3-4/10, nonradiating associated with severe shortness of breath.  No cough no fever chills denies any peripheral edema.  Patient was diagnosed with hypertension 5-6 years ago, 3 years ago he decided stop taking the blood pressure medication "I did not feel I needed them anymore" and has not been following up with any physician in 3 years.  Assessment & Plan:   Principal Problem:   Acute HFrEF (heart failure with reduced ejection fraction) (HCC) Active Problems:   HTN (hypertension), benign   Tobacco abuse   CHF (congestive heart failure) (HCC)  Acute/subacute CHF decompensation TTE with EF 20-25 - likely Secondary to uncontrolled htn -continue diuresis Lasix 40 mg IV twice daily - cont home losartan 100 mg daily, and Coreg 3.125 twice daily -Bilateral pleural effusion mild-moderate, no sig dyspnea at rest and no o2 requirement, expect improvement with IV diuresis, hold off Thoracentesis. -Other Ddx, PE ruled out. -Risk factor modification, check A1c and lipid panel, UDS, start statin - cardiology consulted - continue to  monitor I/os - no sig chest pain or trop elevation to suggest acute ACS though will need ischemic eval at some point   HTN emergency -Resolved, resume home BP meds as above   DVT prophylaxis: lovenox Code Status: full Family Communication: girlfriend updated telephonically 10/9  Level of care: Telemetry Medical Status is: Inpatient Remains inpatient appropriate because: need for ongoing inpt w/u    Consultants:  cardiology  Procedures: none  Antimicrobials:  none    Subjective: No chest pain, no dyspnea at rest  Objective: Vitals:   11/07/21 0700 11/07/21 0830 11/07/21 0909 11/07/21 1258  BP: (!) 137/107 (!) 135/120 (!) 145/124 (!) 137/102  Pulse: 93 87 94 90  Resp: 19 (!) 29 18 18   Temp:   98 F (36.7 C) 97.6 F (36.4 C)  TempSrc:   Oral Oral  SpO2: 96% 94% 93%   Weight:      Height:        Intake/Output Summary (Last 24 hours) at 11/07/2021 1424 Last data filed at 11/07/2021 1300 Gross per 24 hour  Intake 480 ml  Output 1900 ml  Net -1420 ml   Filed Weights   11/07/21 0102  Weight: 90.7 kg    Examination:  General exam: Appears calm and comfortable  Respiratory system: Clear to auscultation. Respiratory effort normal. Cardiovascular system: S1 & S2 heard, RRR. No JVD, murmurs, rubs, gallops or clicks. No pedal edema. Gastrointestinal system: Abdomen is nondistended, soft and nontender. No organomegaly or masses felt. Normal bowel sounds heard. Central nervous system: Alert and oriented. No focal neurological deficits. Extremities: Symmetric 5 x 5 power. Skin:  No rashes, lesions or ulcers Psychiatry: Judgement and insight appear normal. Mood & affect appropriate.     Data Reviewed: I have personally reviewed following labs and imaging studies  CBC: Recent Labs  Lab 11/06/21 0710  WBC 6.1  HGB 14.3  HCT 43.2  MCV 82.1  PLT 103*   Basic Metabolic Panel: Recent Labs  Lab 11/06/21 0710 11/07/21 0320  NA 140 139  K 3.6 3.9  CL 107 107   CO2 26 23  GLUCOSE 112* 85  BUN 16 15  CREATININE 1.12 1.12  CALCIUM 8.9 8.7*   GFR: Estimated Creatinine Clearance: 80.8 mL/min (by C-G formula based on SCr of 1.12 mg/dL). Liver Function Tests: No results for input(s): "AST", "ALT", "ALKPHOS", "BILITOT", "PROT", "ALBUMIN" in the last 168 hours. No results for input(s): "LIPASE", "AMYLASE" in the last 168 hours. No results for input(s): "AMMONIA" in the last 168 hours. Coagulation Profile: No results for input(s): "INR", "PROTIME" in the last 168 hours. Cardiac Enzymes: No results for input(s): "CKTOTAL", "CKMB", "CKMBINDEX", "TROPONINI" in the last 168 hours. BNP (last 3 results) No results for input(s): "PROBNP" in the last 8760 hours. HbA1C: Recent Labs    11/07/21 0320  HGBA1C 5.7*   CBG: No results for input(s): "GLUCAP" in the last 168 hours. Lipid Profile: Recent Labs    11/07/21 0320  CHOL 169  HDL 46  LDLCALC 106*  TRIG 84  CHOLHDL 3.7   Thyroid Function Tests: Recent Labs    11/07/21 0320  TSH 2.694   Anemia Panel: No results for input(s): "VITAMINB12", "FOLATE", "FERRITIN", "TIBC", "IRON", "RETICCTPCT" in the last 72 hours. Urine analysis: No results found for: "COLORURINE", "APPEARANCEUR", "LABSPEC", "PHURINE", "GLUCOSEU", "HGBUR", "BILIRUBINUR", "KETONESUR", "PROTEINUR", "UROBILINOGEN", "NITRITE", "LEUKOCYTESUR" Sepsis Labs: @LABRCNTIP (procalcitonin:4,lacticidven:4)  )No results found for this or any previous visit (from the past 240 hour(s)).       Radiology Studies: ECHOCARDIOGRAM COMPLETE  Result Date: 11/07/2021    ECHOCARDIOGRAM REPORT   Patient Name:   JALEIL WLODARSKI Date of Exam: 11/07/2021 Medical Rec #:  [id number]       Height:       72.0 in Accession #:    [id number]      Weight:       200.0 lb Date of Birth:  1965-11-12       BSA:          2.131 m Patient Age:    56 years        BP:           137/107 mmHg Patient Gender: M               HR:           90 bpm. Exam Location:   Inpatient Procedure: 2D Echo, Cardiac Doppler and Color Doppler Indications:    CHF  History:        Patient has no prior history of Echocardiogram examinations.                 Risk Factors:Hypertension.  Sonographer:    Gaynell Face Referring Phys: [id number] PING T ZHANG IMPRESSIONS  1. Left ventricular ejection fraction, by estimation, is 20 to 25%. The left ventricle has severely decreased function. The left ventricle demonstrates global hypokinesis. Left ventricular diastolic parameters are consistent with Grade II diastolic dysfunction (pseudonormalization).  2. Right ventricular systolic function is normal. The right ventricular size is normal.  3. Left atrial size was severely dilated.  4. Moderate pleural effusion in the  left lateral region.  5. The mitral valve is normal in structure. Mild mitral valve regurgitation. No evidence of mitral stenosis.  6. The aortic valve is tricuspid. There is mild calcification of the aortic valve. There is mild thickening of the aortic valve. Aortic valve regurgitation is not visualized. Aortic valve sclerosis is present, with no evidence of aortic valve stenosis.  7. The inferior vena cava is normal in size with greater than 50% respiratory variability, suggesting right atrial pressure of 3 mmHg. FINDINGS  Left Ventricle: Left ventricular ejection fraction, by estimation, is 20 to 25%. The left ventricle has severely decreased function. The left ventricle demonstrates global hypokinesis. The left ventricular internal cavity size was normal in size. There is no left ventricular hypertrophy. Left ventricular diastolic parameters are consistent with Grade II diastolic dysfunction (pseudonormalization). Right Ventricle: The right ventricular size is normal. No increase in right ventricular wall thickness. Right ventricular systolic function is normal. Left Atrium: Left atrial size was severely dilated. Right Atrium: Right atrial size was normal in size. Pericardium: There is  no evidence of pericardial effusion. Mitral Valve: The mitral valve is normal in structure. Mild mitral valve regurgitation. No evidence of mitral valve stenosis. Tricuspid Valve: The tricuspid valve is normal in structure. Tricuspid valve regurgitation is mild . No evidence of tricuspid stenosis. Aortic Valve: The aortic valve is tricuspid. There is mild calcification of the aortic valve. There is mild thickening of the aortic valve. Aortic valve regurgitation is not visualized. Aortic valve sclerosis is present, with no evidence of aortic valve stenosis. Aortic valve mean gradient measures 1.0 mmHg. Aortic valve peak gradient measures 2.5 mmHg. Aortic valve area, by VTI measures 3.37 cm. Pulmonic Valve: The pulmonic valve was normal in structure. Pulmonic valve regurgitation is mild. No evidence of pulmonic stenosis. Aorta: The aortic root is normal in size and structure. Venous: The inferior vena cava is normal in size with greater than 50% respiratory variability, suggesting right atrial pressure of 3 mmHg. IAS/Shunts: No atrial level shunt detected by color flow Doppler. Additional Comments: There is a moderate pleural effusion in the left lateral region.  LEFT VENTRICLE PLAX 2D LVIDd:         5.50 cm LVIDs:         5.10 cm LV PW:         1.10 cm LV IVS:        1.10 cm LVOT diam:     2.60 cm LV SV:         41 LV SV Index:   19 LVOT Area:     5.31 cm  LV Volumes (MOD) LV vol d, MOD A2C: 179.0 ml LV vol d, MOD A4C: 199.0 ml LV vol s, MOD A2C: 133.0 ml LV vol s, MOD A4C: 159.0 ml LV SV MOD A2C:     46.0 ml LV SV MOD A4C:     199.0 ml LV SV MOD BP:      37.8 ml RIGHT VENTRICLE TAPSE (M-mode): 1.7 cm LEFT ATRIUM              Index        RIGHT ATRIUM           Index LA diam:        4.20 cm  1.97 cm/m   RA Area:     15.50 cm LA Vol (A2C):   122.0 ml 57.26 ml/m  RA Volume:   34.90 ml  16.38 ml/m LA Vol (A4C):   116.0  ml 54.45 ml/m LA Biplane Vol: 120.0 ml 56.32 ml/m  AORTIC VALVE AV Area (Vmax):    3.48 cm AV  Area (Vmean):   3.16 cm AV Area (VTI):     3.37 cm AV Vmax:           79.60 cm/s AV Vmean:          54.300 cm/s AV VTI:            0.121 m AV Peak Grad:      2.5 mmHg AV Mean Grad:      1.0 mmHg LVOT Vmax:         52.20 cm/s LVOT Vmean:        32.300 cm/s LVOT VTI:          0.077 m LVOT/AV VTI ratio: 0.64  AORTA Ao Root diam: 3.40 cm Ao Asc diam:  3.20 cm  SHUNTS Systemic VTI:  0.08 m Systemic Diam: 2.60 cm Donato Schultz MD Electronically signed by Donato Schultz MD Signature Date/Time: 11/07/2021/10:20:02 AM    Final    CT Angio Chest PE W/Cm &/Or Wo Cm  Result Date: 11/06/2021 CLINICAL DATA:  Left chest pain, positive D-dimer EXAM: CT ANGIOGRAPHY CHEST WITH CONTRAST TECHNIQUE: Multidetector CT imaging of the chest was performed using the standard protocol during bolus administration of intravenous contrast. Multiplanar CT image reconstructions and MIPs were obtained to evaluate the vascular anatomy. RADIATION DOSE REDUCTION: This exam was performed according to the departmental dose-optimization program which includes automated exposure control, adjustment of the mA and/or kV according to patient size and/or use of iterative reconstruction technique. CONTRAST:  67mL OMNIPAQUE IOHEXOL 350 MG/ML SOLN COMPARISON:  Chest radiograph done earlier today FINDINGS: Cardiovascular: Heart is enlarged in size. There is reflux of contrast into hepatic veins suggesting tricuspid incompetence. There are no intraluminal filling defects in central pulmonary artery branches. Evaluation of small subsegmental peripheral branches is limited by motion artifacts. There is no contrast enhancement in thoracic aorta and the lumen of thoracic aorta is not evaluated. Mediastinum/Nodes: There are slightly enlarged lymph nodes in mediastinum and hilar regions, possibly suggesting reactive hyperplasia. Thyroid appears smaller than usual in size. Lungs/Pleura: Moderate to large bilateral pleural effusions are seen, more so on the right side.  There is mild thickening of interlobular septi. There are faint ground-glass densities in parahilar regions and lower lung fields. There is no focal pulmonary consolidation. There is no pneumothorax. Upper Abdomen: No acute findings are seen. Musculoskeletal: Unremarkable. Review of the MIP images confirms the above findings. IMPRESSION: There is no evidence of pulmonary artery embolism.  Cardiomegaly. Moderate to large bilateral pleural effusions, more so on the right side. Subtle increased markings in both lungs suggest possible pulmonary edema. There is no focal pulmonary consolidation. Electronically Signed   By: Ernie Avena M.D.   On: 11/06/2021 15:01   DG Chest 2 View  Result Date: 11/06/2021 CLINICAL DATA:  56 year old male with history of anterior chest pain for the past 3 days during breathing. EXAM: CHEST - 2 VIEW COMPARISON:  Chest x-ray 04/13/2017. FINDINGS: There is cephalization of the pulmonary vasculature and slight indistinctness of the interstitial markings suggestive of mild pulmonary edema. Trace bilateral pleural effusions. No pneumothorax. Mild cardiomegaly. Upper mediastinal contours are within normal limits. IMPRESSION: 1. The appearance of the chest is most suggestive of mild congestive heart failure, as above. Electronically Signed   By: Trudie Reed M.D.   On: 11/06/2021 07:39        Scheduled Meds:  aspirin EC  81 mg Oral Daily   carvedilol  3.125 mg Oral BID WC   enoxaparin (LOVENOX) injection  40 mg Subcutaneous Q24H   fluticasone  2 spray Each Nare Daily   furosemide  40 mg Intravenous BID   losartan  100 mg Oral Daily   sodium chloride flush  3 mL Intravenous Q12H   Continuous Infusions:  sodium chloride       LOS: 1 day     Silvano Bilis, MD [facility]   If 7PM-7AM, please contact night-coverage [email or web address] Password [email or web address] 11/07/2021, 2:24 PM

## 2021-11-07 NOTE — Progress Notes (Signed)
Heart Failure Nurse Navigator Progress Note  PCP: Renford Dills, MD (hasn't seen for 3+ years) PCP-Cardiologist: NONE Admission Diagnosis: NEW CHF  Admitted from: home alone  Presentation:   John Velasquez presented 10/8 with increased SOB. Pt resting comfortably in recliner with feet elevated on room air. Pt interactive with interview process. Endorses smoking for over 39 years. Quit Saturday 11/05/2021, does not want nicotine patch/gum at this time. No alcohol, no illicit drugs. Pt concerned about weight loss. Pt has significant other that lives in [place]. Lives at home alone in apartment. No immediate social needs noted. Pt does have some concern regarding medication costs, has commercial insurance should receive co-pay cards. Agreeable to [facility] pharmacy to delivery discharge meds when ready.  Pt eager to go home tomorrow, "must return to work on Wednesday". Explained need for cardiology to see in hospital and follow up appointments. Encouraged pt to ask cardiology team length of stay questions.  Pt states he stopped taking BP meds 3+ years ago by self. Was "worried about side effects he had heard about" regarding BP meds. Hasn't seen PCP since then.  Drives reliable vehicle. Prefers to stay with [facility] for cardiology care as he lives close to facility.     ECHO/ LVEF: 20-25%, G2DD  Clinical Course:  Past Medical History:  Diagnosis Date   Cellulitis 04/13/2017   BILATERAL FEET   Hypertension      Social History   Socioeconomic History   Marital status: Divorced    Spouse name: Not on file   Number of children: 1   Years of education: Not on file   Highest education level: High school graduate  Occupational History   Occupation: Manufacuturing    Comment: Neurosurgeon  Tobacco Use   Smoking status: Former    Packs/day: 1.00    Types: Cigarettes    Start date: 01/30/1982    Quit date: 01/30/2021    Years since quitting: 0.7   Smokeless tobacco: Never  Vaping Use    Vaping Use: Never used  Substance and Sexual Activity   Alcohol use: No   Drug use: No   Sexual activity: Yes    Partners: Female    Birth control/protection: Condom  Other Topics Concern   Not on file  Social History Narrative   Not on file   Social Determinants of Health   Financial Resource Strain: Low Risk  (11/07/2021)   Overall Financial Resource Strain (CARDIA)    Difficulty of Paying Living Expenses: Not very hard  Food Insecurity: No Food Insecurity (11/07/2021)   Hunger Vital Sign    Worried About Running Out of Food in the Last Year: Never true    Ran Out of Food in the Last Year: Never true  Transportation Needs: No Transportation Needs (11/07/2021)   PRAPARE - Administrator, Civil Service (Medical): No    Lack of Transportation (Non-Medical): No  Physical Activity: Not on file  Stress: No Stress Concern Present (11/07/2021)   [employer] of Occupational Health - Occupational Stress Questionnaire    Feeling of Stress : Only a little  Social Connections: Not on file    High Risk Criteria for Readmission and/or Poor Patient Outcomes: Heart failure hospital admissions (last 6 months): 1  No Show rate: 45% Difficult social situation: no Demonstrates medication adherence: NO Primary Language: English Literacy level: able to read/write and comprehend. Wears prescription glasses.   Education Assessment and Provision:  Detailed education and instructions provided on  heart failure disease management including the following:  Signs and symptoms of Heart Failure When to call the physician Importance of daily weights Low sodium diet Fluid restriction Medication management Anticipated future follow-up appointments  Patient education given on each of the above topics.  Patient acknowledges understanding via teach back method and acceptance of all instructions.  Education Materials:  "Living Better With Heart Failure" Booklet, HF zone tool, & Daily  Weight Tracker Tool.  Patient has scale at home: no, given from AHF clinic as pt has financial concerns.  Patient has pill box at home: no, given from AHF clinic     Barriers of Care:   -medication compliance -new HF dx -PCP (re-establish) -cardiology care -high no show rate  Considerations/Referrals:   Referral made to Heart Failure Pharmacist Stewardship: yes, appreciated Referral made to Heart Failure CSW/NCM TOC: no Referral made to Heart & Vascular TOC clinic: yes, pending in-patient cardiology assignment.   Spoke with hospitalist, requested AHF team consult d/t new HFrEF.     Pricilla Holm, MSN, RN Heart Failure Nurse Navigator

## 2021-11-07 NOTE — Progress Notes (Signed)
Called by CCMD - pt had 15 beat run of v-tach - MD notified.

## 2021-11-07 NOTE — ED Notes (Signed)
ED TO INPATIENT HANDOFF REPORT  ED Nurse Name and Phone #: Redmond Pulling [phone]  S Name/Age/Gender John Velasquez 56 y.o. male Room/Bed: 008C/008C  Code Status   Code Status: Full Code  Home/SNF/Other Home Patient oriented to: self, place, time, and situation Is this baseline? Yes   Triage Complete: Triage complete  Chief Complaint CHF (congestive heart failure) (HCC) [I50.9]  Triage Note Pt reported to ED with c/o left sided chest pain and tightness that has been occurring intermittently over past few weeks. Pt also endorses shortness of breath. Denies dizziness or nausea/vomiting.    Allergies Allergies  Allergen Reactions   Penicillins Other (See Comments)    Unknown reaction    Level of Care/Admitting Diagnosis ED Disposition     ED Disposition  Admit   Condition  --   Comment  Hospital Area: [facility] [[id number]]  Level of Care: Telemetry Medical [104]  May admit patient to [facility] or [facility] if equivalent level of care is available:: No  Covid Evaluation: Asymptomatic - no recent exposure (last 10 days) testing not required  Diagnosis: CHF (congestive heart failure) [facility]) [[id number]]  Admitting Physician: Emeline General [[id number]]  Attending Physician: Emeline General [[id number]]  Certification:: I certify this patient will need inpatient services for at least 2 midnights  Estimated Length of Stay: 2          B Medical/Surgery History Past Medical History:  Diagnosis Date   Cellulitis 04/13/2017   BILATERAL FEET   Hypertension    Past Surgical History:  Procedure Laterality Date   TONSILLECTOMY       A IV Location/Drains/Wounds Patient Lines/Drains/Airways Status     Active Line/Drains/Airways     Name Placement date Placement time Site Days   Peripheral IV 11/06/21 20 G 1" Anterior;Right Forearm 11/06/21  1249  Forearm  1            Intake/Output Last 24 hours  Intake/Output Summary (Last 24 hours) at 11/07/2021  0742 Last data filed at 11/06/2021 1836 Gross per 24 hour  Intake --  Output 1900 ml  Net -1900 ml    Labs/Imaging Results for orders placed or performed during the hospital encounter of 11/06/21 (from the past 48 hour(s))  Basic metabolic panel     Status: Abnormal   Collection Time: 11/06/21  7:10 AM  Result Value Ref Range   Sodium 140 135 - 145 mmol/L   Potassium 3.6 3.5 - 5.1 mmol/L   Chloride 107 98 - 111 mmol/L   CO2 26 22 - 32 mmol/L   Glucose, Bld 112 (H) 70 - 99 mg/dL    Comment: Glucose reference range applies only to samples taken after fasting for at least 8 hours.   BUN 16 6 - 20 mg/dL   Creatinine, Ser 9.37 0.61 - 1.24 mg/dL   Calcium 8.9 8.9 - 90.2 mg/dL   GFR, Estimated >40 >97 mL/min    Comment: (NOTE) Calculated using the CKD-EPI Creatinine Equation (2021)    Anion gap 7 5 - 15    Comment: Performed at [facility], [address], [place], [place] [address]  CBC     Status: Abnormal   Collection Time: 11/06/21  7:10 AM  Result Value Ref Range   WBC 6.1 4.0 - 10.5 K/uL   RBC 5.26 4.22 - 5.81 MIL/uL   Hemoglobin 14.3 13.0 - 17.0 g/dL   HCT 92.4 26.8 - 34.1 %   MCV 82.1 80.0 -  100.0 fL   MCH 27.2 26.0 - 34.0 pg   MCHC 33.1 30.0 - 36.0 g/dL   RDW 15.1 11.5 - 15.5 %   Platelets 103 (L) 150 - 400 K/uL    Comment: Immature Platelet Fraction may be clinically indicated, consider ordering this additional test [id number] REPEATED TO VERIFY    nRBC 0.0 0.0 - 0.2 %    Comment: Performed at [facility], [place] [address] [place], [place] [address]  Troponin I (High Sensitivity)     Status: Abnormal   Collection Time: 11/06/21  7:10 AM  Result Value Ref Range   Troponin I (High Sensitivity) 46 (H) <18 ng/L    Comment: (NOTE) Elevated high sensitivity troponin I (hsTnI) values and significant  changes across serial measurements may suggest ACS but many other  chronic and acute conditions are known to elevate hsTnI results.  Refer to the "Links"  section for chest pain algorithms and additional  guidance. Performed at [facility], [place] [address] [place], [place] [address]   Brain natriuretic peptide     Status: Abnormal   Collection Time: 11/06/21  7:10 AM  Result Value Ref Range   B Natriuretic Peptide 717.3 (H) 0.0 - 100.0 pg/mL    Comment: Performed at [place] [address], [place], [place] [address]  Troponin I (High Sensitivity)     Status: Abnormal   Collection Time: 11/06/21 10:30 AM  Result Value Ref Range   Troponin I (High Sensitivity) 42 (H) <18 ng/L    Comment: (NOTE) Elevated high sensitivity troponin I (hsTnI) values and significant  changes across serial measurements may suggest ACS but many other  chronic and acute conditions are known to elevate hsTnI results.  Refer to the "Links" section for chest pain algorithms and additional  guidance. Performed at [facility], [place] [address] [place], [place] [address]   D-dimer, quantitative     Status: Abnormal   Collection Time: 11/06/21 12:03 PM  Result Value Ref Range   D-Dimer, Quant 0.63 (H) 0.00 - 0.50 ug/mL-FEU    Comment: (NOTE) At the manufacturer cut-off value of 0.5 g/mL FEU, this assay has a negative predictive value of 95-100%.This assay is intended for use in conjunction with a clinical pretest probability (PTP) assessment model to exclude pulmonary embolism (PE) and deep venous thrombosis (DVT) in outpatients suspected of PE or DVT. Results should be correlated with clinical presentation. Performed at [facility], [place] [address], [place], [place] [address]   TSH     Status: None   Collection Time: 11/06/21 12:30 PM  Result Value Ref Range   TSH 1.410 0.350 - 4.500 uIU/mL    Comment: Performed by a 3rd Generation assay with a functional sensitivity of <=0.01 uIU/mL. Performed at [facility], [place] [address], [place], [place] [address]   Hemoglobin A1c     Status: Abnormal   Collection Time: 11/07/21   3:20 AM  Result Value Ref Range   Hgb A1c MFr Bld 5.7 (H) 4.8 - 5.6 %    Comment: (NOTE) Pre diabetes:          5.7%-6.4%  Diabetes:              >6.4%  Glycemic control for   <7.0% adults with diabetes    Mean Plasma Glucose 116.89 mg/dL    Comment: Performed at [place] [address] [place], [place] [address]  Basic metabolic  panel     Status: Abnormal   Collection Time: 11/07/21  3:20 AM  Result Value Ref Range   Sodium 139 135 - 145 mmol/L   Potassium 3.9 3.5 - 5.1 mmol/L    Comment: HEMOLYSIS AT THIS LEVEL MAY AFFECT RESULT   Chloride 107 98 - 111 mmol/L   CO2 23 22 - 32 mmol/L   Glucose, Bld 85 70 - 99 mg/dL    Comment: Glucose reference range applies only to samples taken after fasting for at least 8 hours.   BUN 15 6 - 20 mg/dL   Creatinine, Ser 1.58 0.61 - 1.24 mg/dL   Calcium 8.7 (L) 8.9 - 10.3 mg/dL   GFR, Estimated >72 >76 mL/min    Comment: (NOTE) Calculated using the CKD-EPI Creatinine Equation (2021)    Anion gap 9 5 - 15    Comment: Performed at [facility], [address] [place], [place] [address]  TSH     Status: None   Collection Time: 11/07/21  3:20 AM  Result Value Ref Range   TSH 2.694 0.350 - 4.500 uIU/mL    Comment: Performed by a 3rd Generation assay with a functional sensitivity of <=0.01 uIU/mL. Performed at [facility], [address] [place], [place] [address]   Lipid panel     Status: Abnormal   Collection Time: 11/07/21  3:20 AM  Result Value Ref Range   Cholesterol 169 0 - 200 mg/dL   Triglycerides 84 <943 mg/dL   HDL 46 >20 mg/dL   Total CHOL/HDL Ratio 3.7 RATIO   VLDL 17 0 - 40 mg/dL   LDL Cholesterol 037 (H) 0 - 99 mg/dL    Comment:        Total Cholesterol/HDL:CHD Risk Coronary Heart Disease Risk Table                     Men   Women  1/2 Average Risk   3.4   3.3  Average Risk       5.0   4.4  2 X Average Risk   9.6   7.1  3 X Average Risk  23.4   11.0        Use the calculated Patient  Ratio above and the CHD Risk Table to determine the patient's CHD Risk.        ATP III CLASSIFICATION (LDL):  <100     mg/dL   Optimal  944-461  mg/dL   Near or Above                    Optimal  130-159  mg/dL   Borderline  901-222  mg/dL   High  >411     mg/dL   Very High Performed at [facility], [address] [place], [place] [address]    CT Angio Chest PE W/Cm &/Or Wo Cm  Result Date: 11/06/2021 CLINICAL DATA:  Left chest pain, positive D-dimer EXAM: CT ANGIOGRAPHY CHEST WITH CONTRAST TECHNIQUE: Multidetector CT imaging of the chest was performed using the standard protocol during bolus administration of intravenous contrast. Multiplanar CT image reconstructions and MIPs were obtained to evaluate the vascular anatomy. RADIATION DOSE REDUCTION: This exam was performed according to the departmental dose-optimization program which includes automated exposure control, adjustment of the mA and/or kV according to patient size and/or use of iterative reconstruction technique. CONTRAST:  55mL OMNIPAQUE IOHEXOL 350 MG/ML SOLN COMPARISON:  Chest radiograph done earlier today FINDINGS:  Cardiovascular: Heart is enlarged in size. There is reflux of contrast into hepatic veins suggesting tricuspid incompetence. There are no intraluminal filling defects in central pulmonary artery branches. Evaluation of small subsegmental peripheral branches is limited by motion artifacts. There is no contrast enhancement in thoracic aorta and the lumen of thoracic aorta is not evaluated. Mediastinum/Nodes: There are slightly enlarged lymph nodes in mediastinum and hilar regions, possibly suggesting reactive hyperplasia. Thyroid appears smaller than usual in size. Lungs/Pleura: Moderate to large bilateral pleural effusions are seen, more so on the right side. There is mild thickening of interlobular septi. There are faint ground-glass densities in parahilar regions and lower lung fields. There is no focal pulmonary  consolidation. There is no pneumothorax. Upper Abdomen: No acute findings are seen. Musculoskeletal: Unremarkable. Review of the MIP images confirms the above findings. IMPRESSION: There is no evidence of pulmonary artery embolism.  Cardiomegaly. Moderate to large bilateral pleural effusions, more so on the right side. Subtle increased markings in both lungs suggest possible pulmonary edema. There is no focal pulmonary consolidation. Electronically Signed   By: Ernie Avena M.D.   On: 11/06/2021 15:01   DG Chest 2 View  Result Date: 11/06/2021 CLINICAL DATA:  56 year old male with history of anterior chest pain for the past 3 days during breathing. EXAM: CHEST - 2 VIEW COMPARISON:  Chest x-ray 04/13/2017. FINDINGS: There is cephalization of the pulmonary vasculature and slight indistinctness of the interstitial markings suggestive of mild pulmonary edema. Trace bilateral pleural effusions. No pneumothorax. Mild cardiomegaly. Upper mediastinal contours are within normal limits. IMPRESSION: 1. The appearance of the chest is most suggestive of mild congestive heart failure, as above. Electronically Signed   By: Trudie Reed M.D.   On: 11/06/2021 07:39    Pending Labs Unresulted Labs (From admission, onward)     Start     Ordered   11/07/21 0500  Basic metabolic panel  Daily at 5am,   R     Comments: As Scheduled for 5 days    11/06/21 1530   11/06/21 1549  Rapid urine drug screen (hospital performed)  ONCE - STAT,   STAT        11/06/21 1548   11/06/21 1528  HIV Antibody (routine testing w rflx)  (HIV Antibody (Routine testing w reflex) panel)  Once,   R        11/06/21 1530            Vitals/Pain Today's Vitals   11/07/21 0530 11/07/21 0600 11/07/21 0630 11/07/21 0700  BP: (!) 138/121 (!) 140/121 (!) 147/126 (!) 137/107  Pulse: (!) 50 84 86 93  Resp: (!) 26 15 14 19   Temp:   98.1 F (36.7 C)   TempSrc:      SpO2: 95% 96% 97% 96%  Weight:      Height:      PainSc:         Isolation Precautions No active isolations  Medications Medications  nitroGLYCERIN (NITROSTAT) SL tablet 0.4 mg (0.4 mg Sublingual Given 11/06/21 1321)  losartan (COZAAR) tablet 100 mg (100 mg Oral Given 11/06/21 1621)  cyclobenzaprine (FLEXERIL) tablet 10 mg (has no administration in time range)  diphenhydrAMINE (BENADRYL) capsule 25 mg (has no administration in time range)  fluticasone (FLONASE) 50 MCG/ACT nasal spray 2 spray (2 sprays Each Nare Given 11/06/21 2245)  sodium chloride flush (NS) 0.9 % injection 3 mL (3 mLs Intravenous Not Given 11/06/21 2218)  sodium chloride flush (NS) 0.9 % injection 3 mL (  has no administration in time range)  0.9 %  sodium chloride infusion (has no administration in time range)  acetaminophen (TYLENOL) tablet 650 mg (has no administration in time range)  ondansetron (ZOFRAN) injection 4 mg (has no administration in time range)  enoxaparin (LOVENOX) injection 40 mg (40 mg Subcutaneous Given 11/06/21 1623)  furosemide (LASIX) injection 40 mg (40 mg Intravenous Given 11/07/21 0725)  carvedilol (COREG) tablet 3.125 mg (3.125 mg Oral Given 11/07/21 0723)  aspirin EC tablet 81 mg (81 mg Oral Given 11/06/21 1621)  iohexol (OMNIPAQUE) 350 MG/ML injection 80 mL (80 mLs Intravenous Contrast Given 11/06/21 1445)    Mobility walks Low fall risk   Focused Assessments Cardiac Assessment Handoff:  Cardiac Rhythm: Normal sinus rhythm No results found for: "CKTOTAL", "CKMB", "CKMBINDEX", "TROPONINI" Lab Results  Component Value Date   DDIMER 0.63 (H) 11/06/2021   Does the Patient currently have chest pain? No    R Recommendations: See Admitting Provider Note  Report given to:   Additional Notes: pleasant, a/ox4, uses the urinal

## 2021-11-07 NOTE — Progress Notes (Signed)
   11/07/21 1459  Mobility  Activity Ambulated independently in hallway  Level of Assistance Independent  Assistive Device None  Distance Ambulated (ft) 500 ft  Activity Response Tolerated well  Mobility Referral Yes  $Mobility charge 1 Mobility   Mobility Specialist Progress Note  Received pt in chair having no complaints and agreeable to mobility. Pt was asymptomatic throughout ambulation and returned to room w/o fault. Left in chair w/ call bell in reach and all needs met.   Lucious Groves Mobility Specialist

## 2021-11-08 ENCOUNTER — Encounter (HOSPITAL_COMMUNITY)
Admission: EM | Disposition: A | Discharge status: Home / Self Care | Payer: Self-pay | Source: Home / Self Care | Attending: Internal Medicine

## 2021-11-08 ENCOUNTER — Inpatient Hospital Stay (HOSPITAL_COMMUNITY): Payer: BC Managed Care – PPO

## 2021-11-08 DIAGNOSIS — I5021 Acute systolic (congestive) heart failure: Secondary | ICD-10-CM

## 2021-11-08 HISTORY — PX: RIGHT/LEFT HEART CATH AND CORONARY ANGIOGRAPHY: CATH118266

## 2021-11-08 LAB — POCT I-STAT 7, (LYTES, BLD GAS, ICA,H+H)
Acid-Base Excess: 1 mmol/L (ref 0.0–2.0)
Bicarbonate: 25.3 mmol/L (ref 20.0–28.0)
Calcium, Ion: 1.21 mmol/L (ref 1.15–1.40)
HCT: 48 % (ref 39.0–52.0)
Hemoglobin: 16.3 g/dL (ref 13.0–17.0)
O2 Saturation: 96 %
Potassium: 3.4 mmol/L — ABNORMAL LOW (ref 3.5–5.1)
Sodium: 140 mmol/L (ref 135–145)
TCO2: 26 mmol/L (ref 22–32)
pCO2 arterial: 38.1 mmHg (ref 32–48)
pH, Arterial: 7.431 (ref 7.35–7.45)
pO2, Arterial: 77 mmHg — ABNORMAL LOW (ref 83–108)

## 2021-11-08 LAB — POCT I-STAT EG7
Acid-Base Excess: 2 mmol/L (ref 0.0–2.0)
Bicarbonate: 26 mmol/L (ref 20.0–28.0)
Calcium, Ion: 1.13 mmol/L — ABNORMAL LOW (ref 1.15–1.40)
HCT: 48 % (ref 39.0–52.0)
Hemoglobin: 16.3 g/dL (ref 13.0–17.0)
O2 Saturation: 59 %
Potassium: 3.3 mmol/L — ABNORMAL LOW (ref 3.5–5.1)
Sodium: 143 mmol/L (ref 135–145)
TCO2: 27 mmol/L (ref 22–32)
pCO2, Ven: 39.5 mmHg — ABNORMAL LOW (ref 44–60)
pH, Ven: 7.426 (ref 7.25–7.43)
pO2, Ven: 30 mmHg — CL (ref 32–45)

## 2021-11-08 LAB — BASIC METABOLIC PANEL
Anion gap: 9 (ref 5–15)
BUN: 21 mg/dL — ABNORMAL HIGH (ref 6–20)
CO2: 26 mmol/L (ref 22–32)
Calcium: 9.1 mg/dL (ref 8.9–10.3)
Chloride: 103 mmol/L (ref 98–111)
Creatinine, Ser: 1.1 mg/dL (ref 0.61–1.24)
GFR, Estimated: >60 mL/min (ref >60)
Glucose, Bld: 104 mg/dL — ABNORMAL HIGH (ref 70–99)
Potassium: 3.6 mmol/L (ref 3.5–5.1)
Sodium: 138 mmol/L (ref 135–145)

## 2021-11-08 SURGERY — RIGHT/LEFT HEART CATH AND CORONARY ANGIOGRAPHY
Anesthesia: LOCAL

## 2021-11-08 MED ORDER — IOHEXOL 350 MG/ML SOLN
INTRAVENOUS | Status: DC | PRN
  Administered 2021-11-08: 30 mL

## 2021-11-08 MED ORDER — HEPARIN SODIUM (PORCINE) 1000 UNIT/ML IJ SOLN
INTRAMUSCULAR | Status: AC
  Filled 2021-11-08: qty 10

## 2021-11-08 MED ORDER — POTASSIUM CHLORIDE CRYS ER 20 MEQ PO TBCR
20.0000 meq | EXTENDED_RELEASE_TABLET | Freq: Once | ORAL | Status: AC
  Administered 2021-11-08: 20 meq via ORAL
  Filled 2021-11-08: qty 1

## 2021-11-08 MED ORDER — FENTANYL CITRATE (PF) 100 MCG/2ML IJ SOLN
INTRAMUSCULAR | Status: DC | PRN
  Administered 2021-11-08: 25 ug via INTRAVENOUS

## 2021-11-08 MED ORDER — VERAPAMIL HCL 2.5 MG/ML IV SOLN
INTRAVENOUS | Status: AC
  Filled 2021-11-08: qty 2

## 2021-11-08 MED ORDER — SODIUM CHLORIDE 0.9% FLUSH: 3.0000 mL | INTRAVENOUS | Status: DC | PRN

## 2021-11-08 MED ORDER — LIDOCAINE HCL (PF) 1 % IJ SOLN
INTRAMUSCULAR | Status: DC | PRN
  Administered 2021-11-08: 5 mL

## 2021-11-08 MED ORDER — LABETALOL HCL 5 MG/ML IV SOLN: 10.0000 mg | INTRAVENOUS | Status: AC | PRN

## 2021-11-08 MED ORDER — HEPARIN (PORCINE) IN NACL 1000-0.9 UT/500ML-% IV SOLN
INTRAVENOUS | Status: AC
  Filled 2021-11-08: qty 1000

## 2021-11-08 MED ORDER — VERAPAMIL HCL 2.5 MG/ML IV SOLN
INTRAVENOUS | Status: DC | PRN
  Administered 2021-11-08: 10 mL via INTRA_ARTERIAL

## 2021-11-08 MED ORDER — GADOBUTROL 1 MMOL/ML IV SOLN
11.0000 mL | Freq: Once | INTRAVENOUS | Status: AC | PRN
  Administered 2021-11-08: 11 mL via INTRAVENOUS

## 2021-11-08 MED ORDER — DAPAGLIFLOZIN PROPANEDIOL 10 MG PO TABS
10.0000 mg | ORAL_TABLET | Freq: Every day | ORAL | Status: DC
  Administered 2021-11-09: 10 mg via ORAL
  Filled 2021-11-08: qty 1

## 2021-11-08 MED ORDER — MIDAZOLAM HCL 2 MG/2ML IJ SOLN
INTRAMUSCULAR | Status: AC
  Filled 2021-11-08: qty 2

## 2021-11-08 MED ORDER — HEPARIN SODIUM (PORCINE) 1000 UNIT/ML IJ SOLN
INTRAMUSCULAR | Status: DC | PRN
  Administered 2021-11-08: 4000 [IU] via INTRAVENOUS

## 2021-11-08 MED ORDER — SODIUM CHLORIDE 0.9 % IV SOLN: 250.0000 mL | INTRAVENOUS | Status: DC | PRN

## 2021-11-08 MED ORDER — HYDRALAZINE HCL 20 MG/ML IJ SOLN: 10.0000 mg | INTRAMUSCULAR | Status: AC | PRN

## 2021-11-08 MED ORDER — HEPARIN (PORCINE) IN NACL 1000-0.9 UT/500ML-% IV SOLN
INTRAVENOUS | Status: DC | PRN
  Administered 2021-11-08 (×2): 500 mL

## 2021-11-08 MED ORDER — SODIUM CHLORIDE 0.9% FLUSH
3.0000 mL | Freq: Two times a day (BID) | INTRAVENOUS | Status: DC
  Administered 2021-11-08 – 2021-11-09 (×2): 3 mL via INTRAVENOUS

## 2021-11-08 MED ORDER — FENTANYL CITRATE (PF) 100 MCG/2ML IJ SOLN
INTRAMUSCULAR | Status: AC
  Filled 2021-11-08: qty 2

## 2021-11-08 MED ORDER — LIDOCAINE HCL (PF) 1 % IJ SOLN
INTRAMUSCULAR | Status: AC
  Filled 2021-11-08: qty 30

## 2021-11-08 MED ORDER — MIDAZOLAM HCL 2 MG/2ML IJ SOLN
INTRAMUSCULAR | Status: DC | PRN
  Administered 2021-11-08: 1 mg via INTRAVENOUS

## 2021-11-08 SURGICAL SUPPLY — 9 items
BAND ZEPHYR COMPRESS 30 LONG (HEMOSTASIS) IMPLANT
CATH BALLN WEDGE 5F 110CM (CATHETERS) IMPLANT
CATH OPTITORQUE TIG 4.0 5F (CATHETERS) IMPLANT
GLIDESHEATH SLEND SS 6F .021 (SHEATH) IMPLANT
GUIDEWIRE INQWIRE 1.5J.035X260 (WIRE) IMPLANT
INQWIRE 1.5J .035X260CM (WIRE) ×1
PACK CARDIAC CATHETERIZATION (CUSTOM PROCEDURE TRAY) ×1 IMPLANT
SHEATH GLIDE SLENDER 4/5FR (SHEATH) IMPLANT
TRANSDUCER W/STOPCOCK (MISCELLANEOUS) ×1 IMPLANT

## 2021-11-08 NOTE — Interval H&P Note (Signed)
History and Physical Interval Note:  11/08/2021 8:11 AM  John Velasquez  has presented today for surgery, with the diagnosis of heart failure.  The various methods of treatment have been discussed with the patient and family. After consideration of risks, benefits and other options for treatment, the patient has consented to  Procedure(s): RIGHT/LEFT HEART CATH AND CORONARY ANGIOGRAPHY (N/A) as a surgical intervention.  The patient's history has been reviewed, patient examined, no change in status, stable for surgery.  I have reviewed the patient's chart and labs.  Questions were answered to the patient's satisfaction.     Tyeasha Ebbs

## 2021-11-08 NOTE — Progress Notes (Signed)
Heart Failure Navigator Progress Note  Assessed for Heart & Vascular TOC clinic readiness.  Patient does not meet criteria as AHF rounding team has been consulted this stay.   HF Navigation team will sign-off.   Pricilla Holm, MSN, RN Heart Failure Nurse Navigator

## 2021-11-08 NOTE — Progress Notes (Addendum)
PROGRESS NOTE/no charge note    John Velasquez  XNA:[id number] DOB: 01-Apr-1965 DOA: 11/06/2021 PCP: Seward Carol, MD  Outpatient Specialists: none    Brief Narrative:   From admission h and p John Velasquez is a 56 y.o. male with medical history significant of HTN, noncompliant with BP meds, presented with increasing shortness of breath.   Symptoms started 2 weeks ago, with new onset of exertional dyspnea.  Walking 5 minutes trigger significant shortness of breath and had to stop and to catch his breath.  No cough no fever chills no chest pains.  6 days ago, patient developed orthopnea, and could not sleep overnight call EMS in the morning, EMS arrived and found patient blood pressure slightly elevated and recommended patient come to the hospital however patient deferred and stay at home.  Last night, patient developed severe orthopnea again and pressure-like chest pain, 3-4/10, nonradiating associated with severe shortness of breath.  No cough no fever chills denies any peripheral edema.  Patient was diagnosed with hypertension 5-6 years ago, 3 years ago he decided stop taking the blood pressure medication "I did not feel I needed them anymore" and has not been following up with any physician in 3 years.  Assessment & Plan:   Principal Problem:   Acute HFrEF (heart failure with reduced ejection fraction) (HCC) Active Problems:   HTN (hypertension), benign   Tobacco abuse   CHF (congestive heart failure) (HCC)  Acute/subacute CHF decompensation, denies chest pain TTE with EF 20-25 - -Bilateral pleural effusion mild-moderate, no sig dyspnea at rest and no o2 requirement,  -Seen by cardiology, status post left right heart cath, cardiac MRI -Currently on Coreg ,Farxiga, Entresto, spironolactone - appreciate cardiology input, will follow recommendation  HTN emergency -bp improved on current regimen   DVT prophylaxis: lovenox Code Status: full Family Communication: girlfriend  updated telephonically 10/9  Remains inpatient appropriate because: possible d/c tomorrow if cleared by cardiology    Consultants:  cardiology  Procedures: Cardiac cath  Antimicrobials:  none    Subjective:  Went to see him twice, once in cardiac cath, once in MRI, talked to bedside RN, no acute issues reported today , No chest pain, no dyspnea at rest will check back in am   Objective: Vitals:   11/08/21 0919 11/08/21 0949 11/08/21 1226 11/08/21 1822  BP: (!) 123/106 (!) 105/94 106/83 (!) 122/97  Pulse: 80 83 80 95  Resp:      Temp:      TempSrc:      SpO2: 95% 94% 94%   Weight:      Height:        Intake/Output Summary (Last 24 hours) at 11/08/2021 1905 Last data filed at 11/08/2021 1200 Gross per 24 hour  Intake 480 ml  Output 850 ml  Net -370 ml   Filed Weights   11/07/21 0102 11/08/21 0439  Weight: 90.7 kg 82.9 kg    Examination:     Data Reviewed: I have personally reviewed following labs and imaging studies  CBC: Recent Labs  Lab 11/06/21 0710 11/08/21 0827 11/08/21 0843  WBC 6.1  --   --   HGB 14.3 16.3 16.3  HCT 43.2 48.0 48.0  MCV 82.1  --   --   PLT 103*  --   --    Basic Metabolic Panel: Recent Labs  Lab 11/06/21 0710 11/07/21 0320 11/08/21 0034 11/08/21 0827 11/08/21 0843  NA 140 139 138 143 140  K 3.6 3.9 3.6 3.3* 3.4*  CL 107 107 103  --   --   CO2 26 23 26   --   --   GLUCOSE 112* 85 104*  --   --   BUN 16 15 21*  --   --   CREATININE 1.12 1.12 1.10  --   --   CALCIUM 8.9 8.7* 9.1  --   --    GFR: Estimated Creatinine Clearance: 82.3 mL/min (by C-G formula based on SCr of 1.1 mg/dL). Liver Function Tests: No results for input(s): "AST", "ALT", "ALKPHOS", "BILITOT", "PROT", "ALBUMIN" in the last 168 hours. No results for input(s): "LIPASE", "AMYLASE" in the last 168 hours. No results for input(s): "AMMONIA" in the last 168 hours. Coagulation Profile: No results for input(s): "INR", "PROTIME" in the last 168  hours. Cardiac Enzymes: No results for input(s): "CKTOTAL", "CKMB", "CKMBINDEX", "TROPONINI" in the last 168 hours. BNP (last 3 results) No results for input(s): "PROBNP" in the last 8760 hours. HbA1C: Recent Labs    11/07/21 0320  HGBA1C 5.7*   CBG: No results for input(s): "GLUCAP" in the last 168 hours. Lipid Profile: Recent Labs    11/07/21 0320  CHOL 169  HDL 46  LDLCALC 106*  TRIG 84  CHOLHDL 3.7   Thyroid Function Tests: Recent Labs    11/07/21 0320  TSH 2.694   Anemia Panel: No results for input(s): "VITAMINB12", "FOLATE", "FERRITIN", "TIBC", "IRON", "RETICCTPCT" in the last 72 hours. Urine analysis: No results found for: "COLORURINE", "APPEARANCEUR", "LABSPEC", "PHURINE", "GLUCOSEU", "HGBUR", "BILIRUBINUR", "KETONESUR", "PROTEINUR", "UROBILINOGEN", "NITRITE", "LEUKOCYTESUR" Sepsis Labs: @LABRCNTIP (procalcitonin:4,lacticidven:4)  )No results found for this or any previous visit (from the past 240 hour(s)).       Radiology Studies: CARDIAC CATHETERIZATION  Result Date: 11/08/2021 HEMODYNAMICS: RA:   2 mmHg (mean) RV:   18 mmHg PA:   14/5 mmHg (8 mean) PCWP:  7 mmHg (mean) LVEDP: 3-5    Estimated Fick CO/CI   4.25 L/min, 2.07 L/min/m2    TPG    1  mmHg     PVR     <1 Wood Units PAPi      4.5  IMPRESSION: 1.   Low pre and post capillary filling pressures, PCWP or PA diastolic likely underestimated. 2.  Moderately reduced cardiac index 3.  Normal PVR 4. Right dominant coronary circulation with no angiography significant CAD. RECOMMENDATIONS: - Plan for cardiac MRI - Hold off on further diuretics. Aditya Sabharwal Advanced Heart Failure 4:41 PM  ECHOCARDIOGRAM COMPLETE  Result Date: 11/07/2021    ECHOCARDIOGRAM REPORT   Patient Name:   John Velasquez Date of Exam: 11/07/2021 Medical Rec #:  Stann Ore       Height:       72.0 in Accession #:    [id number]      Weight:       200.0 lb Date of Birth:  02/04/1965       BSA:          2.131 m Patient Age:    56 years         BP:           137/107 mmHg Patient Gender: M               HR:           90 bpm. Exam Location:  Inpatient Procedure: 2D Echo, Cardiac Doppler and Color Doppler Indications:    CHF  History:        Patient has no prior history of Echocardiogram examinations.  Risk Factors:Hypertension.  Sonographer:    Gaynell Face Referring Phys: [id number] PING T ZHANG IMPRESSIONS  1. Left ventricular ejection fraction, by estimation, is 20 to 25%. The left ventricle has severely decreased function. The left ventricle demonstrates global hypokinesis. Left ventricular diastolic parameters are consistent with Grade II diastolic dysfunction (pseudonormalization).  2. Right ventricular systolic function is normal. The right ventricular size is normal.  3. Left atrial size was severely dilated.  4. Moderate pleural effusion in the left lateral region.  5. The mitral valve is normal in structure. Mild mitral valve regurgitation. No evidence of mitral stenosis.  6. The aortic valve is tricuspid. There is mild calcification of the aortic valve. There is mild thickening of the aortic valve. Aortic valve regurgitation is not visualized. Aortic valve sclerosis is present, with no evidence of aortic valve stenosis.  7. The inferior vena cava is normal in size with greater than 50% respiratory variability, suggesting right atrial pressure of 3 mmHg. FINDINGS  Left Ventricle: Left ventricular ejection fraction, by estimation, is 20 to 25%. The left ventricle has severely decreased function. The left ventricle demonstrates global hypokinesis. The left ventricular internal cavity size was normal in size. There is no left ventricular hypertrophy. Left ventricular diastolic parameters are consistent with Grade II diastolic dysfunction (pseudonormalization). Right Ventricle: The right ventricular size is normal. No increase in right ventricular wall thickness. Right ventricular systolic function is normal. Left Atrium: Left atrial  size was severely dilated. Right Atrium: Right atrial size was normal in size. Pericardium: There is no evidence of pericardial effusion. Mitral Valve: The mitral valve is normal in structure. Mild mitral valve regurgitation. No evidence of mitral valve stenosis. Tricuspid Valve: The tricuspid valve is normal in structure. Tricuspid valve regurgitation is mild . No evidence of tricuspid stenosis. Aortic Valve: The aortic valve is tricuspid. There is mild calcification of the aortic valve. There is mild thickening of the aortic valve. Aortic valve regurgitation is not visualized. Aortic valve sclerosis is present, with no evidence of aortic valve stenosis. Aortic valve mean gradient measures 1.0 mmHg. Aortic valve peak gradient measures 2.5 mmHg. Aortic valve area, by VTI measures 3.37 cm. Pulmonic Valve: The pulmonic valve was normal in structure. Pulmonic valve regurgitation is mild. No evidence of pulmonic stenosis. Aorta: The aortic root is normal in size and structure. Venous: The inferior vena cava is normal in size with greater than 50% respiratory variability, suggesting right atrial pressure of 3 mmHg. IAS/Shunts: No atrial level shunt detected by color flow Doppler. Additional Comments: There is a moderate pleural effusion in the left lateral region.  LEFT VENTRICLE PLAX 2D LVIDd:         5.50 cm LVIDs:         5.10 cm LV PW:         1.10 cm LV IVS:        1.10 cm LVOT diam:     2.60 cm LV SV:         41 LV SV Index:   19 LVOT Area:     5.31 cm  LV Volumes (MOD) LV vol d, MOD A2C: 179.0 ml LV vol d, MOD A4C: 199.0 ml LV vol s, MOD A2C: 133.0 ml LV vol s, MOD A4C: 159.0 ml LV SV MOD A2C:     46.0 ml LV SV MOD A4C:     199.0 ml LV SV MOD BP:      37.8 ml RIGHT VENTRICLE TAPSE (M-mode): 1.7 cm LEFT ATRIUM  Index        RIGHT ATRIUM           Index LA diam:        4.20 cm  1.97 cm/m   RA Area:     15.50 cm LA Vol (A2C):   122.0 ml 57.26 ml/m  RA Volume:   34.90 ml  16.38 ml/m LA Vol (A4C):    116.0 ml 54.45 ml/m LA Biplane Vol: 120.0 ml 56.32 ml/m  AORTIC VALVE AV Area (Vmax):    3.48 cm AV Area (Vmean):   3.16 cm AV Area (VTI):     3.37 cm AV Vmax:           79.60 cm/s AV Vmean:          54.300 cm/s AV VTI:            0.121 m AV Peak Grad:      2.5 mmHg AV Mean Grad:      1.0 mmHg LVOT Vmax:         52.20 cm/s LVOT Vmean:        32.300 cm/s LVOT VTI:          0.077 m LVOT/AV VTI ratio: 0.64  AORTA Ao Root diam: 3.40 cm Ao Asc diam:  3.20 cm  SHUNTS Systemic VTI:  0.08 m Systemic Diam: 2.60 cm Donato Schultz MD Electronically signed by Donato Schultz MD Signature Date/Time: 11/07/2021/10:20:02 AM    Final         Scheduled Meds:  atorvastatin  40 mg Oral Daily   carvedilol  3.125 mg Oral BID WC   [START ON 11/09/2021] dapagliflozin propanediol  10 mg Oral Daily   enoxaparin (LOVENOX) injection  40 mg Subcutaneous Q24H   fluticasone  2 spray Each Nare Daily   sacubitril-valsartan  1 tablet Oral BID   sodium chloride flush  3 mL Intravenous Q12H   sodium chloride flush  3 mL Intravenous Q12H   sodium chloride flush  3 mL Intravenous Q12H   spironolactone  25 mg Oral Daily   Continuous Infusions:  sodium chloride     sodium chloride       LOS: 2 days     Albertine Grates, MD PhD FACP [facility]   If 7PM-7AM, please contact night-coverage [email or web address] Password [email or web address] 11/08/2021, 7:05 PM

## 2021-11-08 NOTE — Plan of Care (Signed)
  Problem: Clinical Measurements: Goal: Cardiovascular complication will be avoided Outcome: Progressing   Problem: Nutrition: Goal: Adequate nutrition will be maintained Outcome: Progressing   Problem: Coping: Goal: Level of anxiety will decrease Outcome: Progressing   Problem: Elimination: Goal: Will not experience complications related to urinary retention Outcome: Progressing   Problem: Safety: Goal: Ability to remain free from injury will improve Outcome: Progressing   Problem: Education: Goal: Ability to verbalize understanding of medication therapies will improve Outcome: Progressing   Problem: Activity: Goal: Ability to return to baseline activity level will improve Outcome: Progressing

## 2021-11-08 NOTE — Progress Notes (Cosign Needed Addendum)
Advanced Heart Failure Rounding Note  PCP-Cardiologist: None  HF cardiologist: Hebert Soho MD  Subjective:   Presented w/ new SOB, orthopnea and intt CP. Echo showed acute biventricular systolic HF w/ EF 64-40%   Seen while leaving room, no complaints. Denies CP and SOB.  Objective:   Weight Range: 82.9 kg Body mass index is 24.78 kg/m.   Vital Signs:   Temp:  [97.6 F (36.4 C)-98 F (36.7 C)] 97.7 F (36.5 C) (10/10 0439) Pulse Rate:  [87-94] 88 (10/10 0439) Resp:  [16-29] 16 (10/10 0439) BP: (122-145)/(98-124) 122/98 (10/10 0439) SpO2:  [93 %-94 %] 94 % (10/09 2025) Weight:  [82.9 kg] 82.9 kg (10/10 0439) Last BM Date : 11/06/21  Weight change: Filed Weights   11/07/21 0102 11/08/21 0439  Weight: 90.7 kg 82.9 kg    Intake/Output:   Intake/Output Summary (Last 24 hours) at 11/08/2021 0739 Last data filed at 11/08/2021 0718 Gross per 24 hour  Intake 680 ml  Output 1525 ml  Net -845 ml      Physical Exam    General:  well appearing.  No respiratory difficulty HEENT: normal Neck: supple. JVD ~6 cm. Carotids 2+ bilat; no bruits. No lymphadenopathy or thyromegaly appreciated. Cor: PMI nondisplaced. Regular rate & rhythm. No rubs, gallops or murmurs. Lungs: clear Abdomen: soft, nontender, nondistended. No hepatosplenomegaly. No bruits or masses. Good bowel sounds. Extremities: no cyanosis, clubbing, rash, edema  Neuro: alert & oriented x 3, cranial nerves grossly intact. moves all 4 extremities w/o difficulty. Affect pleasant.   Telemetry   NSR 90s (Personally reviewed)    EKG    No new EKG to review  Labs    CBC Recent Labs    11/06/21 0710  WBC 6.1  HGB 14.3  HCT 43.2  MCV 82.1  PLT 347*   Basic Metabolic Panel Recent Labs    11/07/21 0320 11/08/21 0034  NA 139 138  K 3.9 3.6  CL 107 103  CO2 23 26  GLUCOSE 85 104*  BUN 15 21*  CREATININE 1.12 1.10  CALCIUM 8.7* 9.1   Liver Function Tests No results for input(s):  "AST", "ALT", "ALKPHOS", "BILITOT", "PROT", "ALBUMIN" in the last 72 hours. No results for input(s): "LIPASE", "AMYLASE" in the last 72 hours. Cardiac Enzymes No results for input(s): "CKTOTAL", "CKMB", "CKMBINDEX", "TROPONINI" in the last 72 hours.  BNP: BNP (last 3 results) Recent Labs    11/06/21 0710  BNP 717.3*    ProBNP (last 3 results) No results for input(s): "PROBNP" in the last 8760 hours.   D-Dimer Recent Labs    11/06/21 1203  DDIMER 0.63*   Hemoglobin A1C Recent Labs    11/07/21 0320  HGBA1C 5.7*   Fasting Lipid Panel Recent Labs    11/07/21 0320  CHOL 169  HDL 46  LDLCALC 106*  TRIG 84  CHOLHDL 3.7   Thyroid Function Tests Recent Labs    11/07/21 0320  TSH 2.694    Other results:   Imaging    ECHOCARDIOGRAM COMPLETE  Result Date: 11/07/2021    ECHOCARDIOGRAM REPORT   Patient Name:   John Velasquez Date of Exam: 11/07/2021 Medical Rec #:  [id number]       Height:       72.0 in Accession #:    [id number]      Weight:       200.0 lb Date of Birth:  1966/01/22       BSA:  2.131 m Patient Age:    56 years        BP:           137/107 mmHg Patient Gender: M               HR:           90 bpm. Exam Location:  Inpatient Procedure: 2D Echo, Cardiac Doppler and Color Doppler Indications:    CHF  History:        Patient has no prior history of Echocardiogram examinations.                 Risk Factors:Hypertension.  Sonographer:    Gaynell Face Referring Phys: [id number] PING T ZHANG IMPRESSIONS  1. Left ventricular ejection fraction, by estimation, is 20 to 25%. The left ventricle has severely decreased function. The left ventricle demonstrates global hypokinesis. Left ventricular diastolic parameters are consistent with Grade II diastolic dysfunction (pseudonormalization).  2. Right ventricular systolic function is normal. The right ventricular size is normal.  3. Left atrial size was severely dilated.  4. Moderate pleural effusion in the left lateral  region.  5. The mitral valve is normal in structure. Mild mitral valve regurgitation. No evidence of mitral stenosis.  6. The aortic valve is tricuspid. There is mild calcification of the aortic valve. There is mild thickening of the aortic valve. Aortic valve regurgitation is not visualized. Aortic valve sclerosis is present, with no evidence of aortic valve stenosis.  7. The inferior vena cava is normal in size with greater than 50% respiratory variability, suggesting right atrial pressure of 3 mmHg. FINDINGS  Left Ventricle: Left ventricular ejection fraction, by estimation, is 20 to 25%. The left ventricle has severely decreased function. The left ventricle demonstrates global hypokinesis. The left ventricular internal cavity size was normal in size. There is no left ventricular hypertrophy. Left ventricular diastolic parameters are consistent with Grade II diastolic dysfunction (pseudonormalization). Right Ventricle: The right ventricular size is normal. No increase in right ventricular wall thickness. Right ventricular systolic function is normal. Left Atrium: Left atrial size was severely dilated. Right Atrium: Right atrial size was normal in size. Pericardium: There is no evidence of pericardial effusion. Mitral Valve: The mitral valve is normal in structure. Mild mitral valve regurgitation. No evidence of mitral valve stenosis. Tricuspid Valve: The tricuspid valve is normal in structure. Tricuspid valve regurgitation is mild . No evidence of tricuspid stenosis. Aortic Valve: The aortic valve is tricuspid. There is mild calcification of the aortic valve. There is mild thickening of the aortic valve. Aortic valve regurgitation is not visualized. Aortic valve sclerosis is present, with no evidence of aortic valve stenosis. Aortic valve mean gradient measures 1.0 mmHg. Aortic valve peak gradient measures 2.5 mmHg. Aortic valve area, by VTI measures 3.37 cm. Pulmonic Valve: The pulmonic valve was normal in  structure. Pulmonic valve regurgitation is mild. No evidence of pulmonic stenosis. Aorta: The aortic root is normal in size and structure. Venous: The inferior vena cava is normal in size with greater than 50% respiratory variability, suggesting right atrial pressure of 3 mmHg. IAS/Shunts: No atrial level shunt detected by color flow Doppler. Additional Comments: There is a moderate pleural effusion in the left lateral region.  LEFT VENTRICLE PLAX 2D LVIDd:         5.50 cm LVIDs:         5.10 cm LV PW:         1.10 cm LV IVS:  1.10 cm LVOT diam:     2.60 cm LV SV:         41 LV SV Index:   19 LVOT Area:     5.31 cm  LV Volumes (MOD) LV vol d, MOD A2C: 179.0 ml LV vol d, MOD A4C: 199.0 ml LV vol s, MOD A2C: 133.0 ml LV vol s, MOD A4C: 159.0 ml LV SV MOD A2C:     46.0 ml LV SV MOD A4C:     199.0 ml LV SV MOD BP:      37.8 ml RIGHT VENTRICLE TAPSE (M-mode): 1.7 cm LEFT ATRIUM              Index        RIGHT ATRIUM           Index LA diam:        4.20 cm  1.97 cm/m   RA Area:     15.50 cm LA Vol (A2C):   122.0 ml 57.26 ml/m  RA Volume:   34.90 ml  16.38 ml/m LA Vol (A4C):   116.0 ml 54.45 ml/m LA Biplane Vol: 120.0 ml 56.32 ml/m  AORTIC VALVE AV Area (Vmax):    3.48 cm AV Area (Vmean):   3.16 cm AV Area (VTI):     3.37 cm AV Vmax:           79.60 cm/s AV Vmean:          54.300 cm/s AV VTI:            0.121 m AV Peak Grad:      2.5 mmHg AV Mean Grad:      1.0 mmHg LVOT Vmax:         52.20 cm/s LVOT Vmean:        32.300 cm/s LVOT VTI:          0.077 m LVOT/AV VTI ratio: 0.64  AORTA Ao Root diam: 3.40 cm Ao Asc diam:  3.20 cm  SHUNTS Systemic VTI:  0.08 m Systemic Diam: 2.60 cm John Schultz MD Electronically signed by John Schultz MD Signature Date/Time: 11/07/2021/10:20:02 AM    Final      Medications:     Scheduled Medications:  atorvastatin  40 mg Oral Daily   carvedilol  3.125 mg Oral BID WC   enoxaparin (LOVENOX) injection  40 mg Subcutaneous Q24H   fluticasone  2 spray Each Nare Daily    sacubitril-valsartan  1 tablet Oral BID   sodium chloride flush  3 mL Intravenous Q12H   sodium chloride flush  3 mL Intravenous Q12H   spironolactone  25 mg Oral Daily    Infusions:  sodium chloride     sodium chloride     sodium chloride 10 mL/hr at 11/08/21 0637    PRN Medications: sodium chloride, sodium chloride, acetaminophen, cyclobenzaprine, diphenhydrAMINE, nitroGLYCERIN, ondansetron (ZOFRAN) IV, mouth rinse, sodium chloride flush, sodium chloride flush    Patient Profile   Mr. Schlarb is a 56y.o AA male with PMH of HTN and tobacco abuse. Presented to ED with SOB, orthopnea and CP. Echo revealed acute biventricular systolic heart failure w/ EF 20-25%.. AHF team asked to see for further workup.   Assessment/Plan   Acute biventricular systolic heart failure - Echo EF 20-25%, LV global hypokinesis,  GIIDD, Normal RV. LA severely dilated. Mild MR and TR.  - Presented NYHA class III-IIIb, BNP slightly elevated at 717, CTA (-) for PE but bilateral pulmonary congestion and bilateral pleural effusion R>L -  Suspect CP/HF of ischemic nature w/ 39 year h/o smoking. Could also be 2/2 uncontrolled HTN.  - Diuresed w/ IV lasix yesterday, hold diuretics today. Appears euvolemic, will reassess post cath - Continue carvedilol 3.125mg  BID, avoid up titraiting until cath complete.  - L/RHC today - Continue Entresto 97/103 - A1c 5.7. Start Farxiga 10mg  daily today - Continue spiro 25 daily - Strict I&O, daily weights Chest pain - Hstrop elevated 46> 42>51, EKG showed ST depression and T wave inversion on V5 and V6 - Continue ASA - No CP at the moment Hypertension - w/ narrow-pulse pressure - Mgmt as above - Consider bidil if BP remains elevated Hyperlipidemia - LDL 106 goal <70 - Continue atrovastatin 40mg  daily 5. Smoker - heavy smoker, 1PPD/39 yrs - Quit as of Saturday - Doesn't want patch for now, aware of option   Length of Stay: 2  , AGACNP-BC  11/08/2021,  7:39 AM  Advanced Heart Failure Team Pager [phone] (M-F; 7a - 5p)  Please contact [facility] for night-coverage after hours (5p -7a ) and weekends on [email or web address]

## 2021-11-08 NOTE — TOC Initial Note (Addendum)
Transition of Care [facility]) - Initial/Assessment Note    Patient Details  Name: John Velasquez MRN: [id number] Date of Birth: [birth year]  Transition of Care [facility]) CM/SW Contact:    Erenest Rasher, RN Phone Number: [phone] 11/08/2021, 3:43 PM  Clinical Narrative:                 HF TOC CM spoke to pt at bedside. Pt works full-time. Will need a  note for work. Spoke to pt and states he was scheduled to see Dr Delfina Redwood. Contacted office and pt cancelled appt. They will have to follow up with provider to see if pt can establish.   Will request meds come up from [place] if dc tomorrow.   Expected Discharge Plan: Home/Self Care Barriers to Discharge: No Barriers Identified   Patient Goals and CMS Choice        Expected Discharge Plan and Services Expected Discharge Plan: Home/Self Care   Discharge Planning Services: CM Consult                                          Prior Living Arrangements/Services   Lives with:: Parents Patient language and need for interpreter reviewed:: Yes Do you feel safe going back to the place where you live?: Yes      Need for Family Participation in Patient Care: No (Comment) Care giver support system in place?: No (comment)   Criminal Activity/Legal Involvement Pertinent to Current Situation/Hospitalization: No - Comment as needed  Activities of Daily Living      Permission Sought/Granted Permission sought to share information with : Case Manager, Family Supports, PCP Permission granted to share information with : Yes, Verbal Permission Granted  Share Information with NAME: Pallis Tosh     Permission granted to share info w Relationship: mother  Permission granted to share info w Contact Information: [phone]  Emotional Assessment Appearance:: Appears stated age Attitude/Demeanor/Rapport: Engaged Affect (typically observed): Accepting Orientation: : Oriented to Self, Oriented to Place, Oriented to  Time,  Oriented to Situation   Psych Involvement: No (comment)  Admission diagnosis:  CHF (congestive heart failure) ([place]) [I50.9] Hypertension, unspecified type [I10] Congestive heart failure, unspecified HF chronicity, unspecified heart failure type [facility]) [I50.9] Patient Active Problem List   Diagnosis Date Noted   Acute HFrEF (heart failure with reduced ejection fraction) ([place]) 11/07/2021   Acute on chronic diastolic CHF (congestive heart failure) ([place]) 11/06/2021   CHF (congestive heart failure) ([place]) 11/06/2021   Cellulitis 04/13/2017   Tinea 04/13/2017   Lactic acidosis 04/13/2017   Hypokalemia 04/13/2017   Tobacco abuse 04/13/2017   Hyperglycemia 04/13/2017   Cellulitis of foot    HTN (hypertension), benign 03/10/2016   Leukonychia 03/10/2016   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   PCP:  Seward Carol, MD Pharmacy:   [facility] [place], [place] [address] [place] [address] Phone: [phone] Fax: [phone]  [place] ([place]), [place] - [address] [place]) [place] [address] Phone: [phone] Fax: [phone]     Social Determinants of Health (SDOH) Interventions Food Insecurity Interventions: Intervention Not Indicated Housing Interventions: Intervention Not Indicated Transportation Interventions: Intervention Not Indicated Utilities Interventions: Intervention Not Indicated Alcohol Usage Interventions: Intervention Not Indicated (Score <7) Financial Strain Interventions: Intervention Not Indicated  Stress Interventions: Intervention Not Indicated  Readmission Risk Interventions     No data to display

## 2021-11-09 ENCOUNTER — Other Ambulatory Visit (HOSPITAL_COMMUNITY): Payer: Self-pay

## 2021-11-09 ENCOUNTER — Encounter (HOSPITAL_COMMUNITY): Payer: Self-pay | Admitting: Cardiology

## 2021-11-09 LAB — POCT I-STAT EG7
Acid-Base Excess: 2 mmol/L (ref 0.0–2.0)
Bicarbonate: 27.5 mmol/L (ref 20.0–28.0)
Calcium, Ion: 1.2 mmol/L (ref 1.15–1.40)
HCT: 49 % (ref 39.0–52.0)
Hemoglobin: 16.7 g/dL (ref 13.0–17.0)
O2 Saturation: 58 %
Potassium: 3.5 mmol/L (ref 3.5–5.1)
Sodium: 141 mmol/L (ref 135–145)
TCO2: 29 mmol/L (ref 22–32)
pCO2, Ven: 42.8 mmHg — ABNORMAL LOW (ref 44–60)
pH, Ven: 7.416 (ref 7.25–7.43)
pO2, Ven: 30 mmHg — CL (ref 32–45)

## 2021-11-09 LAB — BASIC METABOLIC PANEL
Anion gap: 9 (ref 5–15)
BUN: 19 mg/dL (ref 6–20)
CO2: 23 mmol/L (ref 22–32)
Calcium: 9.1 mg/dL (ref 8.9–10.3)
Chloride: 108 mmol/L (ref 98–111)
Creatinine, Ser: 1.09 mg/dL (ref 0.61–1.24)
GFR, Estimated: >60 mL/min (ref >60)
Glucose, Bld: 112 mg/dL — ABNORMAL HIGH (ref 70–99)
Potassium: 4.1 mmol/L (ref 3.5–5.1)
Sodium: 140 mmol/L (ref 135–145)

## 2021-11-09 LAB — MAGNESIUM: Magnesium: 2 mg/dL (ref 1.7–2.4)

## 2021-11-09 MED ORDER — ISOSORB DINITRATE-HYDRALAZINE 20-37.5 MG PO TABS
0.5000 | ORAL_TABLET | Freq: Three times a day (TID) | ORAL | Status: DC
  Administered 2021-11-09: 0.5 via ORAL
  Filled 2021-11-09: qty 1

## 2021-11-09 MED ORDER — SPIRONOLACTONE 25 MG PO TABS
25.0000 mg | ORAL_TABLET | Freq: Every day | ORAL | 0 refills | Status: DC
  Filled 2021-11-09: qty 30, 30d supply, fill #0

## 2021-11-09 MED ORDER — ISOSORB DINITRATE-HYDRALAZINE 20-37.5 MG PO TABS
0.5000 | ORAL_TABLET | Freq: Three times a day (TID) | ORAL | 0 refills | Status: DC
  Filled 2021-11-09: qty 45, 30d supply, fill #0

## 2021-11-09 MED ORDER — FUROSEMIDE 20 MG PO TABS
20.0000 mg | ORAL_TABLET | Freq: Every day | ORAL | 0 refills | Status: DC | PRN
  Filled 2021-11-09: qty 30, 30d supply, fill #0

## 2021-11-09 MED ORDER — DAPAGLIFLOZIN PROPANEDIOL 10 MG PO TABS
10.0000 mg | ORAL_TABLET | Freq: Every day | ORAL | 0 refills | Status: DC
  Filled 2021-11-09: qty 30, 30d supply, fill #0

## 2021-11-09 MED ORDER — ATORVASTATIN CALCIUM 40 MG PO TABS
40.0000 mg | ORAL_TABLET | Freq: Every day | ORAL | 0 refills | Status: DC
  Filled 2021-11-09: qty 30, 30d supply, fill #0

## 2021-11-09 MED ORDER — CARVEDILOL 3.125 MG PO TABS
3.1250 mg | ORAL_TABLET | Freq: Two times a day (BID) | ORAL | 0 refills | Status: DC
  Filled 2021-11-09: qty 60, 30d supply, fill #0

## 2021-11-09 MED ORDER — SACUBITRIL-VALSARTAN 97-103 MG PO TABS
1.0000 | ORAL_TABLET | Freq: Two times a day (BID) | ORAL | 0 refills | Status: DC
  Filled 2021-11-09: qty 60, 30d supply, fill #0

## 2021-11-09 NOTE — Plan of Care (Signed)
  Problem: Education: Goal: Knowledge of General Education information will improve Description: Including pain rating scale, medication(s)/side effects and non-pharmacologic comfort measures Outcome: Adequate for Discharge   Problem: Health Behavior/Discharge Planning: Goal: Ability to manage health-related needs will improve Outcome: Adequate for Discharge   Problem: Clinical Measurements: Goal: Ability to maintain clinical measurements within normal limits will improve Outcome: Adequate for Discharge Goal: Will remain free from infection Outcome: Adequate for Discharge Goal: Diagnostic test results will improve Outcome: Adequate for Discharge Goal: Respiratory complications will improve Outcome: Adequate for Discharge Goal: Cardiovascular complication will be avoided Outcome: Adequate for Discharge   Problem: Activity: Goal: Risk for activity intolerance will decrease Outcome: Adequate for Discharge   Problem: Nutrition: Goal: Adequate nutrition will be maintained Outcome: Adequate for Discharge   Problem: Coping: Goal: Level of anxiety will decrease Outcome: Adequate for Discharge   Problem: Elimination: Goal: Will not experience complications related to bowel motility Outcome: Adequate for Discharge Goal: Will not experience complications related to urinary retention Outcome: Adequate for Discharge   Problem: Pain Managment: Goal: General experience of comfort will improve Outcome: Adequate for Discharge   Problem: Safety: Goal: Ability to remain free from injury will improve Outcome: Adequate for Discharge   Problem: Skin Integrity: Goal: Risk for impaired skin integrity will decrease Outcome: Adequate for Discharge   Problem: Education: Goal: Ability to demonstrate management of disease process will improve Outcome: Adequate for Discharge Goal: Ability to verbalize understanding of medication therapies will improve Outcome: Adequate for Discharge Goal:  Individualized Educational Video(s) Outcome: Adequate for Discharge   Problem: Activity: Goal: Capacity to carry out activities will improve Outcome: Adequate for Discharge   Problem: Cardiac: Goal: Ability to achieve and maintain adequate cardiopulmonary perfusion will improve Outcome: Adequate for Discharge   Problem: Education: Goal: Understanding of CV disease, CV risk reduction, and recovery process will improve Outcome: Adequate for Discharge Goal: Individualized Educational Video(s) Outcome: Adequate for Discharge   Problem: Activity: Goal: Ability to return to baseline activity level will improve Outcome: Adequate for Discharge   Problem: Cardiovascular: Goal: Ability to achieve and maintain adequate cardiovascular perfusion will improve Outcome: Adequate for Discharge Goal: Vascular access site(s) Level 0-1 will be maintained Outcome: Adequate for Discharge   Problem: Health Behavior/Discharge Planning: Goal: Ability to safely manage health-related needs after discharge will improve Outcome: Adequate for Discharge   

## 2021-11-09 NOTE — Discharge Summary (Signed)
Discharge Summary  JKAI ARWOOD PQZ:[id number] DOB: 11-Sep-1965  PCP: Renford Dills, MD  Admit date: 11/06/2021 Discharge date: 11/09/2021    Time spent: , more than 50% time spent on coordination of care.   Recommendations for Outpatient Follow-up:  F/u with PCP within a week  for hospital discharge follow up, repeat cbc/bmp at follow up F/u with cardiology/heart failure team as scheduled   Unresulted Labs (From admission, onward)     Start     Ordered   11/09/21 0500  Lipoprotein A (LPA)  Tomorrow morning,   R       Question:  Specimen collection method  Answer:  Lab=Lab collect   11/08/21 0904                  Discharge Diagnoses:  Active Hospital Problems   Diagnosis Date Noted   Acute HFrEF (heart failure with reduced ejection fraction) (HCC) 11/07/2021   CHF (congestive heart failure) (HCC) 11/06/2021   Tobacco abuse 04/13/2017   HTN (hypertension), benign 03/10/2016    Resolved Hospital Problems  No resolved problems to display.    Discharge Condition: stable  Diet recommendation: heart healthy  Filed Weights   11/07/21 0102 11/08/21 0439 11/09/21 0502  Weight: 90.7 kg 82.9 kg 83.1 kg    History of present illness: ( per admitting MD Dr Chipper Herb) Chief Complaint: SOB   HPI: John Velasquez is a 56 y.o. male with medical history significant of HTN, noncompliant with BP meds, presented with increasing shortness of breath.   Symptoms started 2 weeks ago, with new onset of exertional dyspnea.  Walking 5 minutes trigger significant shortness of breath and had to stop and to catch his breath.  No cough no fever chills no chest pains.  6 days ago, patient developed orthopnea, and could not sleep overnight call EMS in the morning, EMS arrived and found patient blood pressure slightly elevated and recommended patient come to the hospital however patient deferred and stay at home.  Last night, patient developed severe orthopnea again and pressure-like chest  pain, 3-4/10, nonradiating associated with severe shortness of breath.  No cough no fever chills denies any peripheral edema.  Patient was diagnosed with hypertension 5-6 years ago, 3 years ago he decided stop taking the blood pressure medication "I did not feel I needed them anymore" and has not been following up with any physician in 3 years.   ED Course: Initial blood pressure significantly elevated systolic of 160, diastolic more than 120, patient was given 1 nitroglycerin sublingual and breathing symptoms significantly improved.  CT angiogram negative for PE but bilateral pulmonary congestion and bilateral pleural effusion right> left.  Hospital Course:  Principal Problem:   Acute HFrEF (heart failure with reduced ejection fraction) (HCC) Active Problems:   HTN (hypertension), benign   Tobacco abuse   CHF (congestive heart failure) (HCC)   Acute/subacute CHF decompensation, denies chest pain -TTE with EF 20-25 - -Bilateral pleural effusion mild-moderate, no sig dyspnea at rest and no o2 requirement,  -Seen by cardiology/heart failure team - status post left right heart cath  Low pre and post capillary filling pressures, PCWP or PA diastolic likely underestimated. Moderately reduced cardiac index 2.07 L/min/m2, Normal PVR cardiac MRI - cMRI yesterday: Mild LV dilation with EF 16%, global hypokinesis, Normal RV size with EF 27%. Suggestive of prior endocarditis -started on Coreg ,Farxiga, Entresto, bidil, spironolactone and prn lasix 20mg   - he appear euvolemic at discharge, he is cleared to discharge home with  close cardiology heart failure clinic   HTN emergency -bp improved on current regimen   Discharge Exam: BP (!) 132/110   Pulse 89   Temp 97.8 F (36.6 C) (Oral)   Resp 18   Ht 6' (1.829 m)   Wt 83.1 kg   SpO2 98%   BMI 24.85 kg/m   General: NAD Cardiovascular: RRR Respiratory: normal respiratory effort     Discharge Instructions     Diet - low sodium heart  healthy   Complete by: As directed    Increase activity slowly   Complete by: As directed       Allergies as of 11/09/2021       Reactions   Penicillins Other (See Comments)   Unknown reaction        Medication List     STOP taking these medications    cyclobenzaprine 10 MG tablet Commonly known as: FLEXERIL   ibuprofen 800 MG tablet Commonly known as: ADVIL       TAKE these medications    atorvastatin 40 MG tablet Commonly known as: LIPITOR Take 1 tablet (40 mg total) by mouth daily. Start taking on: November 10, 2021   carvedilol 3.125 MG tablet Commonly known as: COREG Take 1 tablet (3.125 mg total) by mouth 2 (two) times daily with a meal.   dapagliflozin propanediol 10 MG Tabs tablet Commonly known as: FARXIGA Take 1 tablet (10 mg total) by mouth daily. Start taking on: November 10, 2021   furosemide 20 MG tablet Commonly known as: Lasix Take 1 tablet (20 mg total) by mouth daily as needed for fluid or edema.   isosorbide-hydrALAZINE 20-37.5 MG tablet Commonly known as: BIDIL Take 0.5 tablets by mouth 3 (three) times daily.   sacubitril-valsartan 97-103 MG Commonly known as: ENTRESTO Take 1 tablet by mouth 2 (two) times daily.   spironolactone 25 MG tablet Commonly known as: ALDACTONE Take 1 tablet (25 mg total) by mouth daily. Start taking on: November 10, 2021       Allergies  Allergen Reactions   Penicillins Other (See Comments)    Unknown reaction    Follow-up Information     [facility] Follow up on 11/18/2021.   Specialty: Cardiology Why: follow-up with Dr. Gasper Lloyd in the advanced heart failure clinic at [facility]. Entrance C, free valet.  October 20th at 11 am. Contact information: [address] [id number] VO [id number] [address] [phone]        Renford Dills, MD Follow up in 2 week(s).   Specialty: Internal Medicine Why: hospital discharge follow  up Contact information: [address] [place] [address] [phone]                  The results of significant diagnostics from this hospitalization (including imaging, microbiology, ancillary and laboratory) are listed below for reference.    Significant Diagnostic Studies: MR CARDIAC VELOCITY FLOW MAP  Result Date: 11/09/2021 : Reported with cardiac MRI Electronically Signed   By: Marca Ancona M.D.   On: 11/09/2021 07:46   MR CARDIAC MORPHOLOGY W WO CONTRAST  Result Date: 11/09/2021 CLINICAL DATA:  Cardiomyopathy of uncertain etiology EXAM: CARDIAC MRI TECHNIQUE: The patient was scanned on a 1.5 Tesla GE magnet. A dedicated cardiac coil was used. Functional imaging was done using Fiesta sequences. 2,3, and 4 chamber views were done to assess for RWMA's. Modified Simpson's rule using a short axis stack was used to calculate  an ejection fraction on a dedicated work [employer]. The patient received 8 cc of Gadavist. After 10 minutes inversion recovery sequences were used to assess for infiltration and scar tissue. FINDINGS: Limited images of the lung fields showed no gross abnormalities other than small right pleural effusion. Mildly dilated left ventricular size with mild LV hypertrophy. Global hypokinesis with EF 16%. Normal right ventricular size with EF 27%. Moderate left atrial enlargement, mild right atrial enlargement. Trileaflet aortic valve with no stenosis or regurgitation. No significant mitral regurgitation noted. On delayed enhancement imaging, there was mid-wall late gadolinium enhancement (LGE) in the basal to mid septum. There was patchy mid-wall to subepicardial LGE in the basal to mid lateral wall. MEASUREMENTS: MEASUREMENTS LVEDV 268 mL LVEDVi 130 mL/m2 LVSV 44 mL LVEF 16% RVEDV 149 mL RVEDVi 73 mL/m2 RVSV 40 mL RVEF 27% T1 1107, ECV 35% T2 50 lateral wall, 51 septum IMPRESSION: 1.  Mild LV dilation with EF 16%, global  hypokinesis. 2.  Normal RV size with EF 27%. 3. Non-coronary LGE pattern with mid-wall LGE in the basal to mid septum and mid-wall and subepicardial LGE in the basal to mid lateral wall. This could suggest prior myocarditis. T2 signal not elevated in the septum or lateral wall, making acute myocarditis less likely. 4. ECV percentage mildly elevated at 35, this range is not suggestive of cardiac amyloidosis (suggestive when > 40). Dalton Mclean Electronically Signed   By: Marca Ancona M.D.   On: 11/09/2021 07:46   CARDIAC CATHETERIZATION  Result Date: 11/08/2021 HEMODYNAMICS: RA:   2 mmHg (mean) RV:   18 mmHg PA:   14/5 mmHg (8 mean) PCWP:  7 mmHg (mean) LVEDP: 3-5    Estimated Fick CO/CI   4.25 L/min, 2.07 L/min/m2    TPG    1  mmHg     PVR     <1 Wood Units PAPi      4.5  IMPRESSION: 1.   Low pre and post capillary filling pressures, PCWP or PA diastolic likely underestimated. 2.  Moderately reduced cardiac index 3.  Normal PVR 4. Right dominant coronary circulation with no angiography significant CAD. RECOMMENDATIONS: - Plan for cardiac MRI - Hold off on further diuretics. Aditya Sabharwal Advanced Heart Failure 4:41 PM  ECHOCARDIOGRAM COMPLETE  Result Date: 11/07/2021    ECHOCARDIOGRAM REPORT   Patient Name:   TAKUMI SOWERBY Date of Exam: 11/07/2021 Medical Rec #:  [id number]       Height:       72.0 in Accession #:    [id number]      Weight:       200.0 lb Date of Birth:  [birth year]       BSA:          2.131 m Patient Age:    56 years        BP:           137/107 mmHg Patient Gender: M               HR:           90 bpm. Exam Location:  Inpatient Procedure: 2D Echo, Cardiac Doppler and Color Doppler Indications:    CHF  History:        Patient has no prior history of Echocardiogram examinations.                 Risk Factors:Hypertension.  Sonographer:    Gaynell Face Referring Phys: [id number] Emeline General IMPRESSIONS  1. Left ventricular ejection fraction, by estimation, is 20 to 25%. The left  ventricle has severely decreased function. The left ventricle demonstrates global hypokinesis. Left ventricular diastolic parameters are consistent with Grade II diastolic dysfunction (pseudonormalization).  2. Right ventricular systolic function is normal. The right ventricular size is normal.  3. Left atrial size was severely dilated.  4. Moderate pleural effusion in the left lateral region.  5. The mitral valve is normal in structure. Mild mitral valve regurgitation. No evidence of mitral stenosis.  6. The aortic valve is tricuspid. There is mild calcification of the aortic valve. There is mild thickening of the aortic valve. Aortic valve regurgitation is not visualized. Aortic valve sclerosis is present, with no evidence of aortic valve stenosis.  7. The inferior vena cava is normal in size with greater than 50% respiratory variability, suggesting right atrial pressure of 3 mmHg. FINDINGS  Left Ventricle: Left ventricular ejection fraction, by estimation, is 20 to 25%. The left ventricle has severely decreased function. The left ventricle demonstrates global hypokinesis. The left ventricular internal cavity size was normal in size. There is no left ventricular hypertrophy. Left ventricular diastolic parameters are consistent with Grade II diastolic dysfunction (pseudonormalization). Right Ventricle: The right ventricular size is normal. No increase in right ventricular wall thickness. Right ventricular systolic function is normal. Left Atrium: Left atrial size was severely dilated. Right Atrium: Right atrial size was normal in size. Pericardium: There is no evidence of pericardial effusion. Mitral Valve: The mitral valve is normal in structure. Mild mitral valve regurgitation. No evidence of mitral valve stenosis. Tricuspid Valve: The tricuspid valve is normal in structure. Tricuspid valve regurgitation is mild . No evidence of tricuspid stenosis. Aortic Valve: The aortic valve is tricuspid. There is mild  calcification of the aortic valve. There is mild thickening of the aortic valve. Aortic valve regurgitation is not visualized. Aortic valve sclerosis is present, with no evidence of aortic valve stenosis. Aortic valve mean gradient measures 1.0 mmHg. Aortic valve peak gradient measures 2.5 mmHg. Aortic valve area, by VTI measures 3.37 cm. Pulmonic Valve: The pulmonic valve was normal in structure. Pulmonic valve regurgitation is mild. No evidence of pulmonic stenosis. Aorta: The aortic root is normal in size and structure. Venous: The inferior vena cava is normal in size with greater than 50% respiratory variability, suggesting right atrial pressure of 3 mmHg. IAS/Shunts: No atrial level shunt detected by color flow Doppler. Additional Comments: There is a moderate pleural effusion in the left lateral region.  LEFT VENTRICLE PLAX 2D LVIDd:         5.50 cm LVIDs:         5.10 cm LV PW:         1.10 cm LV IVS:        1.10 cm LVOT diam:     2.60 cm LV SV:         41 LV SV Index:   19 LVOT Area:     5.31 cm  LV Volumes (MOD) LV vol d, MOD A2C: 179.0 ml LV vol d, MOD A4C: 199.0 ml LV vol s, MOD A2C: 133.0 ml LV vol s, MOD A4C: 159.0 ml LV SV MOD A2C:     46.0 ml LV SV MOD A4C:     199.0 ml LV SV MOD BP:      37.8 ml RIGHT VENTRICLE TAPSE (M-mode): 1.7 cm LEFT ATRIUM              Index  RIGHT ATRIUM           Index LA diam:        4.20 cm  1.97 cm/m   RA Area:     15.50 cm LA Vol (A2C):   122.0 ml 57.26 ml/m  RA Volume:   34.90 ml  16.38 ml/m LA Vol (A4C):   116.0 ml 54.45 ml/m LA Biplane Vol: 120.0 ml 56.32 ml/m  AORTIC VALVE AV Area (Vmax):    3.48 cm AV Area (Vmean):   3.16 cm AV Area (VTI):     3.37 cm AV Vmax:           79.60 cm/s AV Vmean:          54.300 cm/s AV VTI:            0.121 m AV Peak Grad:      2.5 mmHg AV Mean Grad:      1.0 mmHg LVOT Vmax:         52.20 cm/s LVOT Vmean:        32.300 cm/s LVOT VTI:          0.077 m LVOT/AV VTI ratio: 0.64  AORTA Ao Root diam: 3.40 cm Ao Asc diam:  3.20  cm  SHUNTS Systemic VTI:  0.08 m Systemic Diam: 2.60 cm Candee Furbish MD Electronically signed by Candee Furbish MD Signature Date/Time: 11/07/2021/10:20:02 AM    Final    CT Angio Chest PE W/Cm &/Or Wo Cm  Result Date: 11/06/2021 CLINICAL DATA:  Left chest pain, positive D-dimer EXAM: CT ANGIOGRAPHY CHEST WITH CONTRAST TECHNIQUE: Multidetector CT imaging of the chest was performed using the standard protocol during bolus administration of intravenous contrast. Multiplanar CT image reconstructions and MIPs were obtained to evaluate the vascular anatomy. RADIATION DOSE REDUCTION: This exam was performed according to the departmental dose-optimization program which includes automated exposure control, adjustment of the mA and/or kV according to patient size and/or use of iterative reconstruction technique. CONTRAST:  73mL OMNIPAQUE IOHEXOL 350 MG/ML SOLN COMPARISON:  Chest radiograph done earlier today FINDINGS: Cardiovascular: Heart is enlarged in size. There is reflux of contrast into hepatic veins suggesting tricuspid incompetence. There are no intraluminal filling defects in central pulmonary artery branches. Evaluation of small subsegmental peripheral branches is limited by motion artifacts. There is no contrast enhancement in thoracic aorta and the lumen of thoracic aorta is not evaluated. Mediastinum/Nodes: There are slightly enlarged lymph nodes in mediastinum and hilar regions, possibly suggesting reactive hyperplasia. Thyroid appears smaller than usual in size. Lungs/Pleura: Moderate to large bilateral pleural effusions are seen, more so on the right side. There is mild thickening of interlobular septi. There are faint ground-glass densities in parahilar regions and lower lung fields. There is no focal pulmonary consolidation. There is no pneumothorax. Upper Abdomen: No acute findings are seen. Musculoskeletal: Unremarkable. Review of the MIP images confirms the above findings. IMPRESSION: There is no evidence  of pulmonary artery embolism.  Cardiomegaly. Moderate to large bilateral pleural effusions, more so on the right side. Subtle increased markings in both lungs suggest possible pulmonary edema. There is no focal pulmonary consolidation. Electronically Signed   By: Elmer Picker M.D.   On: 11/06/2021 15:01   DG Chest 2 View  Result Date: 11/06/2021 CLINICAL DATA:  56 year old male with history of anterior chest pain for the past 3 days during breathing. EXAM: CHEST - 2 VIEW COMPARISON:  Chest x-ray 04/13/2017. FINDINGS: There is cephalization of the pulmonary vasculature and slight indistinctness of the  interstitial markings suggestive of mild pulmonary edema. Trace bilateral pleural effusions. No pneumothorax. Mild cardiomegaly. Upper mediastinal contours are within normal limits. IMPRESSION: 1. The appearance of the chest is most suggestive of mild congestive heart failure, as above. Electronically Signed   By: Trudie Reed M.D.   On: 11/06/2021 07:39    Microbiology: No results found for this or any previous visit (from the past 240 hour(s)).   Labs: Basic Metabolic Panel: Recent Labs  Lab 11/06/21 0710 11/07/21 0320 11/08/21 0034 11/08/21 0827 11/08/21 0843 11/09/21 0720  NA 140 139 138 143 140 140  K 3.6 3.9 3.6 3.3* 3.4* 4.1  CL 107 107 103  --   --  108  CO2 --   --  23  GLUCOSE 112* 85 104*  --   --  112*  BUN 16 15 21*  --   --  19  CREATININE 1.12 1.12 1.10  --   --  1.09  CALCIUM 8.9 8.7* 9.1  --   --  9.1  MG  --   --   --   --   --  2.0   Liver Function Tests: No results for input(s): "AST", "ALT", "ALKPHOS", "BILITOT", "PROT", "ALBUMIN" in the last 168 hours. No results for input(s): "LIPASE", "AMYLASE" in the last 168 hours. No results for input(s): "AMMONIA" in the last 168 hours. CBC: Recent Labs  Lab 11/06/21 0710 11/08/21 0827 11/08/21 0843  WBC 6.1  --   --   HGB 14.3 16.3 16.3  HCT 43.2 48.0 48.0  MCV 82.1  --   --   PLT 103*  --   --     Cardiac Enzymes: No results for input(s): "CKTOTAL", "CKMB", "CKMBINDEX", "TROPONINI" in the last 168 hours. BNP: BNP (last 3 results) Recent Labs    11/06/21 0710  BNP 717.3*    ProBNP (last 3 results) No results for input(s): "PROBNP" in the last 8760 hours.  CBG: No results for input(s): "GLUCAP" in the last 168 hours.  FURTHER DISCHARGE INSTRUCTIONS:   Get Medicines reviewed and adjusted: Please take all your medications with you for your next visit with your Primary MD   Laboratory/radiological data: Please request your Primary MD to go over all hospital tests and procedure/radiological results at the follow up, please ask your Primary MD to get all Hospital records sent to his/her office.   In some cases, they will be blood work, cultures and biopsy results pending at the time of your discharge. Please request that your primary care M.D. goes through all the records of your hospital data and follows up on these results.   Also Note the following: If you experience worsening of your admission symptoms, develop shortness of breath, life threatening emergency, suicidal or homicidal thoughts you must seek medical attention immediately by calling 911 or calling your MD immediately  if symptoms less severe.   You must read complete instructions/literature along with all the possible adverse reactions/side effects for all the Medicines you take and that have been prescribed to you. Take any new Medicines after you have completely understood and accpet all the possible adverse reactions/side effects.    Do not drive when taking Pain medications or sleeping medications (Benzodaizepines)   Do not take more than prescribed Pain, Sleep and Anxiety Medications. It is not advisable to combine anxiety,sleep and pain medications without talking with your primary care practitioner   Special Instructions: If you have smoked or chewed Tobacco  in the last 2 yrs please stop smoking, stop any  regular Alcohol  and or any Recreational drug use.   Wear Seat belts while driving.   Please note: You were cared for by a hospitalist during your hospital stay. Once you are discharged, your primary care physician will handle any further medical issues. Please note that NO REFILLS for any discharge medications will be authorized once you are discharged, as it is imperative that you return to your primary care physician (or establish a relationship with a primary care physician if you do not have one) for your post hospital discharge needs so that they can reassess your need for medications and monitor your lab values.     Signed:  Albertine Grates MD, PhD, FACP  [facility] 11/09/2021, 10:38 AM

## 2021-11-09 NOTE — Progress Notes (Signed)
BP soft 91/76 after starting bidil and farxiga today. Personally saw patient, was stable and asymptomatic.  For now will stop bidil with close f/u in clinic. AVS updated to reflect changes.  Earnie Larsson, AGACNP-BC  11/09/21 12:55 PM

## 2021-11-09 NOTE — Progress Notes (Addendum)
Advanced Heart Failure Rounding Note  PCP-Cardiologist: None  HF cardiologist: Dorthula Nettles MD  Subjective:   Presented w/ new SOB, orthopnea and intt CP. Echo showed acute biventricular systolic HF w/ EF 20-25%   L/RHC yesterday: RA:                  2 mmHg (mean) RV:                  18 mmHg PA:                  14/5 mmHg (8 mean) PCWP:            7 mmHg (mean) LVEDP: 3-5 Estimated Fick CO/CI   4.25 L/min, 2.07 L/min/m2           TPG                 1  mmHg                                              PVR                 <1 Wood Units  PAPi                4.5   IMPRESSION: 1. Low pre and post capillary filling pressures, PCWP or PA diastolic likely underestimated.  2. Moderately reduced cardiac index 3. Normal PVR 4. Right dominant coronary circulation with no angiography significant CAD.  Seen while leaving room, no complaints. Denies CP and SOB.  cMRI yesterday:  -Mild LV dilation with EF 16%, global hypokinesis. -Normal RV size with EF 27%. -Non-coronary LGE pattern with mid-wall LGE in the basal to mid septum and mid-wall and subepicardial LGE in the basal to mid lateral wall. This could suggest prior myocarditis. T2 signal not elevated in the septum or lateral wall, making acute myocarditis less likely. -ECV percentage mildly elevated at 35, this range is not suggestive of cardiac amyloidosis (suggestive when > 40).  Pt in bed, anxious to get home. Denies CP and SOB.   Objective:   Weight Range: 83.1 kg Body mass index is 24.85 kg/m.   Vital Signs:   Temp:  [97.7 F (36.5 C)-97.8 F (36.6 C)] 97.8 F (36.6 C) (10/11 0502) Pulse Rate:  [0-100] 89 (10/11 0812) Resp:  [15-20] 18 (10/10 0845) BP: (105-132)/(83-110) 132/110 (10/11 0812) SpO2:  [88 %-98 %] 98 % (10/11 0502) Weight:  [83.1 kg] 83.1 kg (10/11 0502) Last BM Date : 11/06/21  Weight change: Filed Weights   11/07/21 0102 11/08/21 0439 11/09/21 0502  Weight: 90.7 kg 82.9 kg 83.1 kg     Intake/Output:   Intake/Output Summary (Last 24 hours) at 11/09/2021 0822 Last data filed at 11/09/2021 0814 Gross per 24 hour  Intake 963 ml  Output 400 ml  Net 563 ml      Physical Exam    General:  well appearing.  No respiratory difficulty HEENT: normal Neck: supple. JVD ~6 cm. Carotids 2+ bilat; no bruits. No lymphadenopathy or thyromegaly appreciated. Cor: PMI nondisplaced. Regular rate & rhythm. No rubs, gallops or murmurs. Lungs: clear Abdomen: soft, nontender, nondistended. No hepatosplenomegaly. No bruits or masses. Good bowel sounds. Extremities: no cyanosis, clubbing, rash, edema  Neuro: alert & oriented x 3, cranial nerves grossly intact. moves all 4 extremities w/o  difficulty. Affect pleasant.   Telemetry   NSR 80s, 0-2 pvcs/min NSVT (4 beats)(Personally reviewed)    EKG    No new EKG to review  Labs    CBC Recent Labs    11/08/21 0827 11/08/21 0843  HGB 16.3 16.3  HCT 48.0 10.1   Basic Metabolic Panel Recent Labs    11/07/21 0320 11/08/21 0034 11/08/21 0827 11/08/21 0843  NA 139 138 143 140  K 3.9 3.6 3.3* 3.4*  CL 107 103  --   --   CO2 23 26  --   --   GLUCOSE 85 104*  --   --   BUN 15 21*  --   --   CREATININE 1.12 1.10  --   --   CALCIUM 8.7* 9.1  --   --    Liver Function Tests No results for input(s): "AST", "ALT", "ALKPHOS", "BILITOT", "PROT", "ALBUMIN" in the last 72 hours. No results for input(s): "LIPASE", "AMYLASE" in the last 72 hours. Cardiac Enzymes No results for input(s): "CKTOTAL", "CKMB", "CKMBINDEX", "TROPONINI" in the last 72 hours.  BNP: BNP (last 3 results) Recent Labs    11/06/21 0710  BNP 717.3*    ProBNP (last 3 results) No results for input(s): "PROBNP" in the last 8760 hours.   D-Dimer Recent Labs    11/06/21 1203  DDIMER 0.63*   Hemoglobin A1C Recent Labs    11/07/21 0320  HGBA1C 5.7*   Fasting Lipid Panel Recent Labs    11/07/21 0320  CHOL 169  HDL 46  LDLCALC 106*  TRIG 84   CHOLHDL 3.7   Thyroid Function Tests Recent Labs    11/07/21 0320  TSH 2.694    Other results:   Imaging    MR CARDIAC VELOCITY FLOW MAP  Result Date: 11/09/2021 : Reported with cardiac MRI Electronically Signed   By: Loralie Champagne M.D.   On: 11/09/2021 07:46   MR CARDIAC MORPHOLOGY W WO CONTRAST  Result Date: 11/09/2021 CLINICAL DATA:  Cardiomyopathy of uncertain etiology EXAM: CARDIAC MRI TECHNIQUE: The patient was scanned on a 1.5 Tesla GE magnet. A dedicated cardiac coil was used. Functional imaging was done using Fiesta sequences. 2,3, and 4 chamber views were done to assess for RWMA's. Modified Simpson's rule using a short axis stack was used to calculate an ejection fraction on a dedicated work Conservation officer, nature. The patient received 8 cc of Gadavist. After 10 minutes inversion recovery sequences were used to assess for infiltration and scar tissue. FINDINGS: Limited images of the lung fields showed no gross abnormalities other than small right pleural effusion. Mildly dilated left ventricular size with mild LV hypertrophy. Global hypokinesis with EF 16%. Normal right ventricular size with EF 27%. Moderate left atrial enlargement, mild right atrial enlargement. Trileaflet aortic valve with no stenosis or regurgitation. No significant mitral regurgitation noted. On delayed enhancement imaging, there was mid-wall late gadolinium enhancement (LGE) in the basal to mid septum. There was patchy mid-wall to subepicardial LGE in the basal to mid lateral wall. MEASUREMENTS: MEASUREMENTS LVEDV 268 mL LVEDVi 130 mL/m2 LVSV 44 mL LVEF 16% RVEDV 149 mL RVEDVi 73 mL/m2 RVSV 40 mL RVEF 27% T1 1107, ECV 35% T2 50 lateral wall, 51 septum IMPRESSION: 1.  Mild LV dilation with EF 16%, global hypokinesis. 2.  Normal RV size with EF 27%. 3. Non-coronary LGE pattern with mid-wall LGE in the basal to mid septum and mid-wall and subepicardial LGE in the basal to mid lateral wall. This  could  suggest prior myocarditis. T2 signal not elevated in the septum or lateral wall, making acute myocarditis less likely. 4. ECV percentage mildly elevated at 35, this range is not suggestive of cardiac amyloidosis (suggestive when > 40). John Velasquez Electronically Signed   By: Marca Ancona M.D.   On: 11/09/2021 07:46     Medications:     Scheduled Medications:  atorvastatin  40 mg Oral Daily   carvedilol  3.125 mg Oral BID WC   dapagliflozin propanediol  10 mg Oral Daily   enoxaparin (LOVENOX) injection  40 mg Subcutaneous Q24H   fluticasone  2 spray Each Nare Daily   sacubitril-valsartan  1 tablet Oral BID   sodium chloride flush  3 mL Intravenous Q12H   sodium chloride flush  3 mL Intravenous Q12H   sodium chloride flush  3 mL Intravenous Q12H   spironolactone  25 mg Oral Daily    Infusions:  sodium chloride     sodium chloride      PRN Medications: sodium chloride, sodium chloride, acetaminophen, cyclobenzaprine, diphenhydrAMINE, nitroGLYCERIN, ondansetron (ZOFRAN) IV, mouth rinse, sodium chloride flush, sodium chloride flush    Patient Profile   John Velasquez is a 56y.o AA male with PMH of HTN and tobacco abuse. Presented to ED with SOB, orthopnea and CP. Echo revealed acute biventricular systolic heart failure w/ EF 20-25%.. AHF team asked to see for further workup.   Assessment/Plan   Acute biventricular systolic heart failure - Echo EF 20-25%, LV global hypokinesis,  GIIDD, Normal RV. LA severely dilated. Mild MR and TR.  - Presented NYHA class III-IIIb, BNP slightly elevated at 717, CTA (-) for PE but bilateral pulmonary congestion and bilateral pleural effusion R>L - Initially suspect CP/HF of ischemic nature w/ 39 year h/o smoking or 2/2 uncontrolled HTN. cMRI suggestive of prior endocarditis - Diuretics held yesterday, dry on cath - Continue carvedilol 3.125mg  BID, avoid up titraiting until cath complete.  - L/RHC yesterday: Low pre and post capillary filling  pressures, PCWP or PA diastolic likely underestimated. Moderately reduced cardiac index 2.07 L/min/m2, Normal PVR  - cMRI yesterday: Mild LV dilation with EF 16%, global hypokinesis, Normal RV size with EF 27%. Suggestive of prior endocarditis - Continue Entresto 97/103 - Continue Farxiga 10mg  daily today. A1c 5.7.  - Start bidil 1/2 tab - Continue spiro 25 daily - Strict I&O, daily weights Chest pain - Hstrop elevated 46> 42>51, EKG showed ST depression and T wave inversion on V5 and V6 - Continue ASA - No CP at the moment Hypertension - w/ narrow-pulse pressure - Mgmt as above - Start bidil 1/2 tab Hyperlipidemia - LDL 106 goal <70 - Continue atrovastatin 40mg  daily 5. Smoker - heavy smoker, 1PPD/39 yrs - Quit as of Saturday - Doesn't want patch for now, aware of option  Ok to go home from AHF standpoint. F/u scheduled in clinic. No PRN diuretic needed for now, will reassess at f/u. Encouraged to get BP machine and scale, says he's going to the store after d/c.   AHF d/c meds: Atorvastatin 40mg  daily Carvedilol 3.125mg  BID Farxiga 10mg  daily Bidil 1/2 tab TID Entresto 97/103mg  BID Spironolactone 25mg  daily  Length of Stay: 3  , AGACNP-BC  11/09/2021, 8:22 AM  Advanced Heart Failure Team Pager [phone] (M-F; 7a - 5p)  Please contact [facility] for night-coverage after hours (5p -7a ) and weekends on [email or web address]

## 2021-11-09 NOTE — Plan of Care (Signed)
  Problem: Clinical Measurements: Goal: Cardiovascular complication will be avoided Outcome: Progressing   Problem: Activity: Goal: Risk for activity intolerance will decrease Outcome: Progressing   Problem: Nutrition: Goal: Adequate nutrition will be maintained Outcome: Progressing   Problem: Coping: Goal: Level of anxiety will decrease Outcome: Progressing   Problem: Elimination: Goal: Will not experience complications related to urinary retention Outcome: Progressing   Problem: Pain Managment: Goal: General experience of comfort will improve Outcome: Progressing   Problem: Safety: Goal: Ability to remain free from injury will improve Outcome: Progressing   Problem: Skin Integrity: Goal: Risk for impaired skin integrity will decrease Outcome: Progressing   Problem: Education: Goal: Ability to verbalize understanding of medication therapies will improve Outcome: Progressing   Problem: Cardiac: Goal: Ability to achieve and maintain adequate cardiopulmonary perfusion will improve Outcome: Progressing   Problem: Cardiovascular: Goal: Ability to achieve and maintain adequate cardiovascular perfusion will improve Outcome: Progressing Goal: Vascular access site(s) Level 0-1 will be maintained Outcome: Progressing

## 2021-11-09 NOTE — Progress Notes (Signed)
Discharge instructions reviewed with pt. Copy of instructions given to pt. Scripts filled by [facility] and delivered to pt's room.  Pt instructed and strongly encouraged to make sure he takes his medications as directed by his doctors. Pt verbalizes understanding and states he plans to do so this time.  Pt d/c'd via wheelchair with belongings.           Escorted by unit staff.

## 2021-11-10 LAB — LIPOPROTEIN A (LPA): Lipoprotein (a): 315.3 nmol/L — ABNORMAL HIGH (ref <75.0)

## 2021-11-18 ENCOUNTER — Ambulatory Visit (HOSPITAL_COMMUNITY)
Admit: 2021-11-18 | Discharge: 2021-11-18 | Disposition: A | Discharge status: Home / Self Care | Payer: BC Managed Care – PPO | Source: Ambulatory Visit | Attending: Cardiology | Admitting: Cardiology

## 2021-11-18 VITALS — BP 140/90 | HR 88 | Wt 186.0 lb

## 2021-11-18 DIAGNOSIS — I428 Other cardiomyopathies: Secondary | ICD-10-CM | POA: Diagnosis not present

## 2021-11-18 DIAGNOSIS — I10 Essential (primary) hypertension: Secondary | ICD-10-CM | POA: Diagnosis not present

## 2021-11-18 DIAGNOSIS — Z79899 Other long term (current) drug therapy: Secondary | ICD-10-CM | POA: Insufficient documentation

## 2021-11-18 DIAGNOSIS — Z87891 Personal history of nicotine dependence: Secondary | ICD-10-CM | POA: Insufficient documentation

## 2021-11-18 DIAGNOSIS — R0609 Other forms of dyspnea: Secondary | ICD-10-CM | POA: Insufficient documentation

## 2021-11-18 DIAGNOSIS — I5033 Acute on chronic diastolic (congestive) heart failure: Secondary | ICD-10-CM | POA: Insufficient documentation

## 2021-11-18 DIAGNOSIS — Z7984 Long term (current) use of oral hypoglycemic drugs: Secondary | ICD-10-CM | POA: Insufficient documentation

## 2021-11-18 DIAGNOSIS — I110 Hypertensive heart disease with heart failure: Secondary | ICD-10-CM | POA: Diagnosis not present

## 2021-11-18 DIAGNOSIS — I5021 Acute systolic (congestive) heart failure: Secondary | ICD-10-CM

## 2021-11-18 LAB — BASIC METABOLIC PANEL
Anion gap: 9 (ref 5–15)
BUN: 14 mg/dL (ref 6–20)
CO2: 28 mmol/L (ref 22–32)
Calcium: 9 mg/dL (ref 8.9–10.3)
Chloride: 104 mmol/L (ref 98–111)
Creatinine, Ser: 0.96 mg/dL (ref 0.61–1.24)
GFR, Estimated: >60 mL/min (ref >60)
Glucose, Bld: 105 mg/dL — ABNORMAL HIGH (ref 70–99)
Potassium: 4.5 mmol/L (ref 3.5–5.1)
Sodium: 141 mmol/L (ref 135–145)

## 2021-11-18 LAB — BRAIN NATRIURETIC PEPTIDE: B Natriuretic Peptide: 283.9 pg/mL — ABNORMAL HIGH (ref 0.0–100.0)

## 2021-11-18 MED ORDER — CARVEDILOL 6.25 MG PO TABS: 6.2500 mg | ORAL_TABLET | Freq: Two times a day (BID) | ORAL | 3 refills | Status: DC

## 2021-11-18 NOTE — Progress Notes (Signed)
ADVANCED HEART FAILURE CLINIC NOTE  Referring Physician: Renford Dills, MD  Primary Care: Renford Dills, MD Primary Cardiologist:  HPI: John Velasquez is a 56 y.o. male with uncontrolled hypertension, tobacco use and recently diagnosed systolic heart failure presenting today to establish care.  I saw John Velasquez at [facility] last week after he presented with a 2-week history of substernal chest pressure, shortness of breath and paroxysmal nocturnal dyspnea.  During that admission he was found to have an LVEF of 20 to 25%, cardiac index of 2.1 by right heart cath and no angiographically significant coronary artery disease.  John Velasquez was started on appropriate GDMT, diuresed and discharged home.  In addition he had a cardiac MRI that was largely unremarkable and consistent with a forementioned echo.  Interval history Since discharge from the hospital, John Velasquez has been compliant with medications.  He is no longer smoking.  From a functional standpoint he is NYHA II.  He has had no trouble with edema.  Doing very well with minimal dyspnea on exertion.  Activity level/exercise tolerance: NYHA II Orthopnea:  Sleeps on 1-2 pillows Paroxysmal noctural dyspnea: No Chest pain/pressure: No Orthostatic lightheadedness: No Palpitations: No Lower extremity edema: No Presyncope/syncope: No Cough: No  Past Medical History:  Diagnosis Date   Cellulitis 04/13/2017   BILATERAL FEET   Hypertension     Current Outpatient Medications  Medication Sig Dispense Refill   atorvastatin (LIPITOR) 40 MG tablet Take 1 tablet (40 mg total) by mouth daily. 30 tablet 0   dapagliflozin propanediol (FARXIGA) 10 MG TABS tablet Take 1 tablet (10 mg total) by mouth daily. 30 tablet 0   furosemide (LASIX) 20 MG tablet Take 1 tablet (20 mg total) by mouth daily as needed for fluid or edema. 30 tablet 0   sacubitril-valsartan (ENTRESTO) 97-103 MG Take 1 tablet by mouth 2 (two) times daily. 60 tablet  0   spironolactone (ALDACTONE) 25 MG tablet Take 1 tablet (25 mg total) by mouth daily. 30 tablet 0   carvedilol (COREG) 6.25 MG tablet Take 1 tablet (6.25 mg total) by mouth 2 (two) times daily with a meal. 180 tablet 3   No current facility-administered medications for this encounter.    Allergies  Allergen Reactions   Penicillins Other (See Comments)    Unknown reaction      Social History   Socioeconomic History   Marital status: Divorced    Spouse name: Not on file   Number of children: 1   Years of education: Not on file   Highest education level: High school graduate  Occupational History   Occupation: Manufacuturing    Comment: Neurosurgeon  Tobacco Use   Smoking status: Former    Packs/day: 1.00    Types: Cigarettes    Start date: 01/30/1982    Quit date: 01/30/2021    Years since quitting: 0.8   Smokeless tobacco: Never  Vaping Use   Vaping Use: Never used  Substance and Sexual Activity   Alcohol use: No   Drug use: No   Sexual activity: Yes    Partners: Female    Birth control/protection: Condom  Other Topics Concern   Not on file  Social History Narrative   Not on file   Social Determinants of Health   Financial Resource Strain: Low Risk  (11/07/2021)   Overall Financial Resource Strain (CARDIA)    Difficulty of Paying Living Expenses: Not very hard  Food Insecurity: No Food Insecurity (11/07/2021)  Hunger Vital Sign    Worried About Running Out of Food in the Last Year: Never true    Ran Out of Food in the Last Year: Never true  Transportation Needs: No Transportation Needs (11/07/2021)   PRAPARE - Hydrologist (Medical): No    Lack of Transportation (Non-Medical): No  Physical Activity: Not on file  Stress: No Stress Concern Present (11/07/2021)   [place]    Feeling of Stress : Only a little  Social Connections: Not on file  Intimate  Partner Violence: Not on file      Family History  Problem Relation Age of Onset   Cancer Maternal Grandmother        unknown   Cancer Paternal Grandmother        unknown   Heart disease Mother    Hypertension Mother    Cancer Father        throat   Alcohol abuse Father     PHYSICAL EXAM: Vitals:   11/18/21 1037  BP: (!) 140/90  Pulse: 88  SpO2: 98%   GENERAL: Well nourished, well developed, and in no apparent distress at rest.  HEENT: Negative for arcus senilis or xanthelasma. There is no scleral icterus.  The mucous membranes are pink and moist.   NECK: Supple, No masses. Normal carotid upstrokes without bruits. No masses or thyromegaly.    CHEST: There are no chest wall deformities. There is no chest wall tenderness. Respirations are unlabored.  Lungs-CTA bilaterally CARDIAC:  JVP: Less than 6 cm H2O         Normal S1, S2  Normal rate with regular rhythm. No murmurs, rubs or gallops.  Pulses are 2+ and symmetrical in upper and lower extremities.  No edema.  ABDOMEN: Soft, non-tender, non-distended. There are no masses or hepatomegaly. There are normal bowel sounds.  EXTREMITIES: Warm and well perfused with no cyanosis, clubbing.  LYMPHATIC: No axillary or supraclavicular lymphadenopathy.  NEUROLOGIC: Patient is oriented x3 with no focal or lateralizing neurologic deficits.  PSYCH: Patients affect is appropriate, there is no evidence of anxiety or depression.  SKIN: Warm and dry; no lesions or wounds.   DATA REVIEW  ECG: 11/22/21: NSR w/ PAC  ECHO: 11/07/21: LVEF 20-25%, Grade II DD, normal RV function; severely dilated LA.   CATH: 11/08/21: HEMODYNAMICS: RA:                  2 mmHg (mean) RV:                  18 mmHg PA:                  14/5 mmHg (8 mean) PCWP:            7 mmHg (mean) LVEDP: 3-5                                      Estimated Fick CO/CI   4.25 L/min, 2.07 L/min/m2                                          TPG                 1  mmHg  PVR                 <1 Wood Units  PAPi                4.5       IMPRESSION: 1.           Low pre and post capillary filling pressures, PCWP or PA diastolic likely underestimated.  2.         Moderately reduced cardiac index 3.         Normal PVR 4.         Right dominant coronary circulation with no angiography significant CAD.   CMR: 11/08/21: 1.  Mild LV dilation with EF 16%, global hypokinesis.  2.  Normal RV size with EF 27%.  3. Non-coronary LGE pattern with mid-wall LGE in the basal to mid septum and mid-wall and subepicardial LGE in the basal to mid lateral wall. This could suggest prior myocarditis. T2 signal not elevated in the septum or lateral wall, making acute myocarditis less likely.  4. ECV percentage mildly elevated at 35, this range is not suggestive of cardiac amyloidosis (suggestive when > 40).  ASSESSMENT & PLAN:  NYHA II, Stage C, Nonischemic cardiomyopathy / HFrEF Etiology of GT:XMIWOE 2/2 hypertensive cardiomyopathy; LHC unremarkable; mildly reduced CI by RHC.  NYHA class / AHA Stage:II Volume status & Diuretics: Euvolemic, continue lasix 20mg  PRN.  Vasodilators:Continue Entresto 97/103 BID Beta-Blocker: Increase Coreg to 6.25 twice daily MRA: Continue spironolactone Cardiometabolic: Continue Farxiga Devices therapies & Valvulopathies: Currently not indicated; will repeat echo in 2 months Advanced therapies: Not indicated  2.  Hypertension -Improving control; increasing Coreg today.  We will debate addition of hydralazine/ISDN on follow-up  3.  Tobacco use -Discussed cessation previously; currently no longer smoking.   Aleksander Edmiston Advanced Heart Failure Mechanical Circulatory Support

## 2021-11-18 NOTE — Patient Instructions (Addendum)
INCREASE your Carvedilol to 6.25 mg Twice daily ( one in the morning and one in the evening)  Take your Lasix as needed for lower leg swelling, weight gain of more than 3lb in 24 hours or 5lb in a week  Labs done today, your results will be available in MyChart, we will contact you for abnormal readings.  Your physician has requested that you have an echocardiogram. Echocardiography is a painless test that uses sound waves to create images of your heart. It provides your doctor with information about the size and shape of your heart and how well your heart's chambers and valves are working. This procedure takes approximately one hour. There are no restrictions for this procedure. Please do NOT wear cologne, perfume, aftershave, or lotions (deodorant is allowed). Please arrive 15 minutes prior to your appointment time.  Please follow up with our heart failure pharmacist in 2-3 weeks.  Your physician recommends that you schedule a follow-up appointment in: 2 months with and echocardiogram   If you have any questions or concerns before your next appointment please send [place] a message through [place] or call our office at [phone].    TO LEAVE A MESSAGE FOR THE NURSE SELECT OPTION 2, PLEASE LEAVE A MESSAGE INCLUDING: YOUR NAME DATE OF BIRTH CALL BACK NUMBER REASON FOR CALL**this is important as we prioritize the call backs  YOU WILL RECEIVE A CALL BACK THE SAME DAY AS LONG AS YOU CALL BEFORE 4:00 PM  At the [facility], you and your health needs are our priority. As part of our continuing mission to provide you with exceptional heart care, we have created designated Provider Care Teams. These Care Teams include your primary Cardiologist (physician) and Advanced Practice Providers (APPs- Physician Assistants and Nurse Practitioners) who all work together to provide you with the care you need, when you need it.   You may see any of the following providers on your designated  Care Team at your next follow up: Dr Glori Bickers Dr Loralie Champagne Dr. Roxana Hires, NP Lyda Jester, [place] [facility] [place], [place] Forestine Na, NP Audry Riles, PharmD   Please be sure to bring in all your medications bottles to every appointment.

## 2021-11-21 DIAGNOSIS — F17210 Nicotine dependence, cigarettes, uncomplicated: Secondary | ICD-10-CM | POA: Diagnosis not present

## 2021-11-21 DIAGNOSIS — I5022 Chronic systolic (congestive) heart failure: Secondary | ICD-10-CM | POA: Diagnosis not present

## 2021-11-21 DIAGNOSIS — N521 Erectile dysfunction due to diseases classified elsewhere: Secondary | ICD-10-CM | POA: Diagnosis not present

## 2021-11-22 ENCOUNTER — Ambulatory Visit: Payer: BC Managed Care – PPO | Admitting: Student

## 2021-12-02 ENCOUNTER — Other Ambulatory Visit (HOSPITAL_COMMUNITY): Payer: Self-pay

## 2021-12-09 ENCOUNTER — Other Ambulatory Visit (HOSPITAL_COMMUNITY): Payer: Self-pay | Admitting: Pharmacist

## 2021-12-09 MED ORDER — SPIRONOLACTONE 25 MG PO TABS: 25.0000 mg | ORAL_TABLET | Freq: Every day | ORAL | 3 refills | Status: DC

## 2021-12-09 MED ORDER — SACUBITRIL-VALSARTAN 97-103 MG PO TABS: 1.0000 | ORAL_TABLET | Freq: Two times a day (BID) | ORAL | 3 refills | Status: DC

## 2021-12-09 MED ORDER — FUROSEMIDE 20 MG PO TABS: 20.0000 mg | ORAL_TABLET | Freq: Every day | ORAL | 3 refills | Status: DC | PRN

## 2021-12-09 MED ORDER — ATORVASTATIN CALCIUM 40 MG PO TABS: 40.0000 mg | ORAL_TABLET | Freq: Every day | ORAL | 3 refills | Status: DC

## 2021-12-09 MED ORDER — DAPAGLIFLOZIN PROPANEDIOL 10 MG PO TABS: 10.0000 mg | ORAL_TABLET | Freq: Every day | ORAL | 3 refills | Status: DC

## 2021-12-15 ENCOUNTER — Other Ambulatory Visit (HOSPITAL_COMMUNITY): Payer: Self-pay

## 2021-12-15 ENCOUNTER — Inpatient Hospital Stay (HOSPITAL_COMMUNITY): Admission: RE | Admit: 2021-12-15 | Payer: BC Managed Care – PPO | Source: Ambulatory Visit

## 2021-12-15 NOTE — Progress Notes (Incomplete)
***  In Progress***    Advanced Heart Failure Clinic Note   Referring Physician: Renford Dills, MD  Primary Care: Renford Dills, MD HF Cardiologist: Dr. Gasper Lloyd   HPI:  John Velasquez is a 56 y.o. male with uncontrolled hypertension, tobacco use and recently diagnosed systolic heart failure. John Velasquez was seen at [facility] 10/2021 after he presented with a 2-week history of substernal chest pressure, shortness of breath and paroxysmal nocturnal dyspnea.  During that admission he was found to have an LVEF of 20 to 25%, cardiac index of 2.1 by right heart cath and no angiographically significant coronary artery disease.  John Velasquez was started on appropriate GDMT, diuresed and discharged home.  In addition he had a cardiac MRI that was largely unremarkable and consistent with aforementioned echo.   Presented to AHF Clinic on 11/18/21 with Dr. Gasper Lloyd. Since discharge from the hospital, John Velasquez noted he had been compliant with medications.  He was no longer smoking.  From a functional standpoint he was NYHA II.  He had had no trouble with edema.  Reported doing very well with minimal dyspnea on exertion.   Today he returns to HF clinic for pharmacist medication titration. At last visit with Dr. Gasper Lloyd, carvedilol was increased to 6.25 mg BID.   Shortness of breath/dyspnea on exertion? {YES NO:22349}  Orthopnea/PND? {YES NO:22349} Edema? {YES NO:22349} Lightheadedness/dizziness? {YES NO:22349} Daily weights at home? {YES NO:22349} Blood pressure/heart rate monitoring at home? {YES [id number] Following low-sodium/fluid-restricted diet? {YES NO:22349}  HF Medications: Carvedilol 6.25 mg BID Entresto 97/103 mg BID Spironolactone 25 mg daily  Dapagliflozin 10 mg daily  Furosemide 20 mg daily PRN  Has the patient been experiencing any side effects to the medications prescribed?  {YES NO:22349}  Does the patient have any problems obtaining medications due to  transportation or finances?   {YES NO:22349}  Understanding of regimen: {excellent/good/fair/poor:19665} Understanding of indications: {excellent/good/fair/poor:19665} Potential of compliance: {excellent/good/fair/poor:19665} Patient understands to avoid NSAIDs. Patient understands to avoid decongestants.    Pertinent Lab Values: Serum creatinine ***, BUN ***, Potassium ***, Sodium ***, BNP ***, Magnesium ***, Digoxin ***   Vital Signs: Weight: *** (last clinic weight: ***) Blood pressure: ***  Heart rate: ***   Assessment/Plan: NYHA II, Stage C, Nonischemic cardiomyopathy / HFrEF Etiology of OV:FIEPPI 2/2 hypertensive cardiomyopathy; LHC unremarkable; mildly reduced CI by RHC.  NYHA class / AHA Stage:II Volume status & Diuretics: Euvolemic, continue Lasix 20 mg PRN.  Vasodilators:Continue Entresto 97/103 mg BID Beta-Blocker: Continue carvedilol 6.25 mg BID MRA: Continue spironolactone 25 mg daily Cardiometabolic: Continue Farxiga 10 mg daily Devices therapies & Valvulopathies: Currently not indicated; repeat echo scheduled for 01/16/22 Advanced therapies: Not indicated   2.  Hypertension -Improving control; increasing Coreg today.  We will debate addition of hydralazine/ISDN on follow-up ***   3.  Tobacco use -Discussed cessation previously; currently no longer smoking.   Follow up 01/16/2022 with Dr. Gasper Lloyd.    Karle Plumber, PharmD, BCPS, [facility], CPP Heart Failure Clinic Pharmacist [phone]

## 2021-12-20 ENCOUNTER — Ambulatory Visit: Payer: BC Managed Care – PPO | Admitting: Family Medicine

## 2022-01-11 ENCOUNTER — Inpatient Hospital Stay (HOSPITAL_COMMUNITY)
Admission: RE | Admit: 2022-01-11 | Discharge: 2022-01-11 | Disposition: A | Discharge status: Home / Self Care | Payer: BC Managed Care – PPO | Source: Ambulatory Visit

## 2022-01-16 ENCOUNTER — Encounter (HOSPITAL_COMMUNITY): Payer: Self-pay | Admitting: Cardiology

## 2022-01-16 ENCOUNTER — Ambulatory Visit (HOSPITAL_COMMUNITY)
Admission: RE | Admit: 2022-01-16 | Discharge: 2022-01-16 | Disposition: A | Discharge status: Home / Self Care | Payer: BC Managed Care – PPO | Source: Ambulatory Visit | Attending: Internal Medicine | Admitting: Internal Medicine

## 2022-01-16 ENCOUNTER — Ambulatory Visit (HOSPITAL_BASED_OUTPATIENT_CLINIC_OR_DEPARTMENT_OTHER)
Admission: RE | Admit: 2022-01-16 | Discharge: 2022-01-16 | Disposition: A | Discharge status: Home / Self Care | Payer: BC Managed Care – PPO | Source: Ambulatory Visit | Attending: Cardiology | Admitting: Cardiology

## 2022-01-16 VITALS — BP 100/60 | HR 60 | Wt 196.8 lb

## 2022-01-16 DIAGNOSIS — Z720 Tobacco use: Secondary | ICD-10-CM | POA: Diagnosis not present

## 2022-01-16 DIAGNOSIS — I10 Essential (primary) hypertension: Secondary | ICD-10-CM

## 2022-01-16 DIAGNOSIS — I5022 Chronic systolic (congestive) heart failure: Secondary | ICD-10-CM | POA: Diagnosis not present

## 2022-01-16 DIAGNOSIS — I5020 Unspecified systolic (congestive) heart failure: Secondary | ICD-10-CM | POA: Insufficient documentation

## 2022-01-16 DIAGNOSIS — I428 Other cardiomyopathies: Secondary | ICD-10-CM | POA: Diagnosis not present

## 2022-01-16 DIAGNOSIS — I110 Hypertensive heart disease with heart failure: Secondary | ICD-10-CM | POA: Diagnosis not present

## 2022-01-16 DIAGNOSIS — Z87891 Personal history of nicotine dependence: Secondary | ICD-10-CM | POA: Diagnosis not present

## 2022-01-16 DIAGNOSIS — I5033 Acute on chronic diastolic (congestive) heart failure: Secondary | ICD-10-CM

## 2022-01-16 DIAGNOSIS — Z79899 Other long term (current) drug therapy: Secondary | ICD-10-CM | POA: Diagnosis not present

## 2022-01-16 LAB — BRAIN NATRIURETIC PEPTIDE: B Natriuretic Peptide: 138 pg/mL — ABNORMAL HIGH (ref 0.0–100.0)

## 2022-01-16 LAB — BASIC METABOLIC PANEL
Anion gap: 6 (ref 5–15)
BUN: 8 mg/dL (ref 6–20)
CO2: 28 mmol/L (ref 22–32)
Calcium: 9 mg/dL (ref 8.9–10.3)
Chloride: 104 mmol/L (ref 98–111)
Creatinine, Ser: 0.91 mg/dL (ref 0.61–1.24)
GFR, Estimated: >60 mL/min (ref >60)
Glucose, Bld: 86 mg/dL (ref 70–99)
Potassium: 4.9 mmol/L (ref 3.5–5.1)
Sodium: 138 mmol/L (ref 135–145)

## 2022-01-16 MED ORDER — NICOTINE 21 MG/24HR TD PT24: 21.0000 mg | MEDICATED_PATCH | Freq: Every day | TRANSDERMAL | 0 refills | Status: DC

## 2022-01-16 NOTE — Progress Notes (Addendum)
ADVANCED HEART FAILURE CLINIC NOTE  Referring Physician: Renford Dills, MD  Primary Care: Renford Dills, MD Primary Cardiologist:  HPI: ZANIEL MARINEAU is a 56 y.o. male with uncontrolled hypertension, tobacco use and recently diagnosed systolic heart failure presenting today to establish care.  I saw Mr. Schwertner at [facility] last week after he presented with a 2-week history of substernal chest pressure, shortness of breath and paroxysmal nocturnal dyspnea.  During that admission he was found to have an LVEF of 20 to 25%, cardiac index of 2.1 by right heart cath and no angiographically significant coronary artery disease.  Mr. Palmateer was started on appropriate GDMT, diuresed and discharged home.  In addition he had a cardiac MRI that was largely unremarkable and consistent with a forementioned echo.  Interval history Since his last appointment Mr. Friday has done very well. He has no significant functional limitations; LE edema has resolved with lasix 20mg  PO daily. He did have some lightheadedness on Thanksgiving but outside of that isolated episode no issues.   Activity level/exercise tolerance: NYHA II Orthopnea:  Sleeps on 1-2 pillows Paroxysmal noctural dyspnea: No Chest pain/pressure: No Orthostatic lightheadedness: No Palpitations: No Lower extremity edema: No Presyncope/syncope: No Cough: No  Past Medical History:  Diagnosis Date   Cellulitis 04/13/2017   BILATERAL FEET   Hypertension     Current Outpatient Medications  Medication Sig Dispense Refill   atorvastatin (LIPITOR) 40 MG tablet Take 1 tablet (40 mg total) by mouth daily. 30 tablet 3   carvedilol (COREG) 6.25 MG tablet Take 1 tablet (6.25 mg total) by mouth 2 (two) times daily with a meal. 180 tablet 3   dapagliflozin propanediol (FARXIGA) 10 MG TABS tablet Take 1 tablet (10 mg total) by mouth daily. 30 tablet 3   furosemide (LASIX) 20 MG tablet Take 20 mg by mouth daily.     nicotine (NICODERM  CQ) 21 mg/24hr patch Place 1 patch (21 mg total) onto the skin daily. 28 patch 0   sacubitril-valsartan (ENTRESTO) 97-103 MG Take 1 tablet by mouth 2 (two) times daily. 60 tablet 3   spironolactone (ALDACTONE) 25 MG tablet Take 1 tablet (25 mg total) by mouth daily. 30 tablet 3   No current facility-administered medications for this encounter.    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  Tobacco Use   Smoking status: Former    Packs/day: 1.00    Types: Cigarettes    Start date: 01/30/1982    Quit date: 01/30/2021    Years since quitting: 0.9   Smokeless tobacco: Never  Vaping Use   Vaping Use: Never used  Substance and Sexual Activity   Alcohol use: No   Drug use: No   Sexual activity: Yes    Partners: Female    Birth control/protection: Condom  Other Topics Concern   Not on file  Social History Narrative   Not on file   Social Determinants of Health   Financial Resource Strain: Low Risk  (11/07/2021)   Overall Financial Resource Strain (CARDIA)    Difficulty of Paying Living Expenses: Not very  hard  Food Insecurity: No Food Insecurity (11/07/2021)   Hunger Vital Sign    Worried About Running Out of Food in the Last Year: Never true    Ran Out of Food in the Last Year: Never true  Transportation Needs: No Transportation Needs (11/07/2021)   PRAPARE - Administrator, Civil Service (Medical): No    Lack of Transportation (Non-Medical): No  Physical Activity: Not on file  Stress: No Stress Concern Present (11/07/2021)   [employer] of Occupational Health - Occupational Stress Questionnaire    Feeling of Stress :  Only a little  Social Connections: Not on file  Intimate Partner Violence: Not on file      Family History  Problem Relation Age of Onset   Cancer Maternal Grandmother        unknown   Cancer Paternal Grandmother        unknown   Heart disease Mother    Hypertension Mother    Cancer Father        throat   Alcohol abuse Father     PHYSICAL EXAM: Vitals:   01/16/22 0942  BP: 100/60  Pulse: 60  SpO2: 96%    GENERAL: Well nourished, well developed, and in no apparent distress at rest.  HEENT: Negative for arcus senilis or xanthelasma. There is no scleral icterus.  The mucous membranes are pink and moist.   NECK: Supple, No masses. Normal carotid upstrokes without bruits. No masses or thyromegaly.    CHEST: There are no chest wall deformities. There is no chest wall tenderness. Respirations are unlabored.  Lungs-CTA bilaterally CARDIAC:  JVP: 5cm         RRR; no murmurs, normal s1,s2.  ABDOMEN: Soft, non-tender, non-distended. There are no masses or hepatomegaly. There are normal bowel sounds.  EXTREMITIES: warm/well perfused w/ no peripheral edema LYMPHATIC: No axillary or supraclavicular lymphadenopathy.  NEUROLOGIC: Patient is oriented x3 with no focal or lateralizing neurologic deficits.  PSYCH: Patients affect is appropriate, there is no evidence of anxiety or depression.  SKIN: Warm and dry; no lesions or wounds.   DATA REVIEW  ECG: 11/22/21: NSR w/ PAC  ECHO: 11/07/21: LVEF 20-25%, Grade II DD, normal RV function; severely dilated LA.   CATH: 11/08/21: HEMODYNAMICS: RA:                  2 mmHg (mean) RV:                  18 mmHg PA:                  14/5 mmHg (8 mean) PCWP:            7 mmHg (mean) LVEDP: 3-5                                      Estimated Fick CO/CI   4.25 L/min, 2.07 L/min/m2                                          TPG                 1  mmHg  PVR                 <1 Wood Units  PAPi                 4.5       IMPRESSION: 1.           Low pre and post capillary filling pressures, PCWP or PA diastolic likely underestimated.  2.         Moderately reduced cardiac index 3.         Normal PVR 4.         Right dominant coronary circulation with no angiography significant CAD.   CMR: 11/08/21: 1.  Mild LV dilation with EF 16%, global hypokinesis.  2.  Normal RV size with EF 27%.  3. Non-coronary LGE pattern with mid-wall LGE in the basal to mid septum and mid-wall and subepicardial LGE in the basal to mid lateral wall. This could suggest prior myocarditis. T2 signal not elevated in the septum or lateral wall, making acute myocarditis less likely.  4. ECV percentage mildly elevated at 35, this range is not suggestive of cardiac amyloidosis (suggestive when > 40).  ASSESSMENT & PLAN:  NYHA II, Stage C, Nonischemic cardiomyopathy / HFrEF Etiology of ZC:HYIFOY 2/2 hypertensive cardiomyopathy; LHC unremarkable; mildly reduced CI by RHC. No significant family history of heart disease.  NYHA class / AHA Stage:II Volume status & Diuretics: Euvolemic, taking lasix 20mg  PO daily.  Vasodilators:Continue Entresto 97/103 BID Beta-Blocker: Increase Coreg to 6.25 twice daily MRA: Continue spironolactone Cardiometabolic: Continue Farxiga Devices therapies & Valvulopathies: TTE w/ some improvement in LVEF, now 30-35%. Will hold off on primary prevention ICD and continue GDMT. Follow up in 66m with repeat echo.  Advanced therapies: Not indicated  2.  Hypertension -Well controlled now, continue above regimen.   3.  Tobacco use -Unfortunately has started smoking again. We discussed importance of cessation at length. Plans to quit.    Omnia Dollinger Advanced Heart Failure Mechanical Circulatory Support

## 2022-01-16 NOTE — Progress Notes (Signed)
Echocardiogram 2D Echocardiogram has been performed.  Augustine Radar 01/16/2022, 9:42 AM

## 2022-01-16 NOTE — Patient Instructions (Signed)
START Nicoderm patch  Labs done today, your results will be available in MyChart, we will contact you for abnormal readings.  Your physician has requested that you have an echocardiogram. Echocardiography is a painless test that uses sound waves to create images of your heart. It provides your doctor with information about the size and shape of your heart and how well your heart's chambers and valves are working. This procedure takes approximately one hour. There are no restrictions for this procedure. Please do NOT wear cologne, perfume, aftershave, or lotions (deodorant is allowed). Please arrive 15 minutes prior to your appointment time.  Your physician recommends that you schedule a follow-up appointment in: 4 months with an echocardiogram (April 2024)  ** please call the office in February to arrange your follow up appointment **  If you have any questions or concerns before your next appointment please send [place] a message through [place] or call our office at [phone].    TO LEAVE A MESSAGE FOR THE NURSE SELECT OPTION 2, PLEASE LEAVE A MESSAGE INCLUDING: YOUR NAME DATE OF BIRTH CALL BACK NUMBER REASON FOR CALL**this is important as we prioritize the call backs  YOU WILL RECEIVE A CALL BACK THE SAME DAY AS LONG AS YOU CALL BEFORE 4:00 PM  At the [facility], you and your health needs are our priority. As part of our continuing mission to provide you with exceptional heart care, we have created designated Provider Care Teams. These Care Teams include your primary Cardiologist (physician) and Advanced Practice Providers (APPs- Physician Assistants and Nurse Practitioners) who all work together to provide you with the care you need, when you need it.   You may see any of the following providers on your designated Care Team at your next follow up: Dr Arvilla Meres Dr Marca Ancona Dr. Marcos Eke, NP Robbie Lis, [place] [facility] [place], [place] Brynda Peon, NP Karle Plumber, PharmD   Please be sure to bring in all your medications bottles to every appointment.

## 2022-01-17 LAB — ECHOCARDIOGRAM COMPLETE
Area-P 1/2: 2.95 cm2
Calc EF: 31.5 %
S' Lateral: 4.4 cm
Single Plane A2C EF: 28.1 %
Single Plane A4C EF: 35.8 %

## 2022-02-06 NOTE — Addendum Note (Signed)
Encounter addended by: Kayvan Hoefling M, RPH-CPP on: 02/06/2022 4:34 PM  Actions taken: Pend clinical note, Delete clinical note

## 2022-05-23 ENCOUNTER — Encounter (HOSPITAL_COMMUNITY): Payer: BC Managed Care – PPO | Admitting: Cardiology

## 2022-05-23 ENCOUNTER — Ambulatory Visit (HOSPITAL_COMMUNITY): Admission: RE | Admit: 2022-05-23 | Payer: BC Managed Care – PPO | Source: Ambulatory Visit

## 2022-05-23 ENCOUNTER — Other Ambulatory Visit (HOSPITAL_COMMUNITY): Payer: BC Managed Care – PPO

## 2022-12-25 ENCOUNTER — Emergency Department (HOSPITAL_COMMUNITY): Payer: BC Managed Care – PPO

## 2022-12-25 ENCOUNTER — Other Ambulatory Visit: Payer: Self-pay

## 2022-12-25 ENCOUNTER — Inpatient Hospital Stay (HOSPITAL_COMMUNITY)
Admission: EM | Admit: 2022-12-25 | Discharge: 2022-12-26 | DRG: 291 | Disposition: A | Discharge status: Home / Self Care | Payer: BC Managed Care – PPO | Attending: Internal Medicine | Admitting: Internal Medicine

## 2022-12-25 ENCOUNTER — Inpatient Hospital Stay (HOSPITAL_COMMUNITY): Payer: BC Managed Care – PPO

## 2022-12-25 ENCOUNTER — Encounter (HOSPITAL_COMMUNITY): Payer: Self-pay | Admitting: *Deleted

## 2022-12-25 DIAGNOSIS — I509 Heart failure, unspecified: Secondary | ICD-10-CM

## 2022-12-25 DIAGNOSIS — Z79899 Other long term (current) drug therapy: Secondary | ICD-10-CM

## 2022-12-25 DIAGNOSIS — I160 Hypertensive urgency: Secondary | ICD-10-CM

## 2022-12-25 DIAGNOSIS — I5023 Acute on chronic systolic (congestive) heart failure: Secondary | ICD-10-CM | POA: Diagnosis not present

## 2022-12-25 DIAGNOSIS — E785 Hyperlipidemia, unspecified: Secondary | ICD-10-CM

## 2022-12-25 DIAGNOSIS — J811 Chronic pulmonary edema: Secondary | ICD-10-CM | POA: Diagnosis not present

## 2022-12-25 DIAGNOSIS — Z91148 Patient's other noncompliance with medication regimen for other reason: Secondary | ICD-10-CM | POA: Diagnosis not present

## 2022-12-25 DIAGNOSIS — Z808 Family history of malignant neoplasm of other organs or systems: Secondary | ICD-10-CM

## 2022-12-25 DIAGNOSIS — Z8249 Family history of ischemic heart disease and other diseases of the circulatory system: Secondary | ICD-10-CM

## 2022-12-25 DIAGNOSIS — I10 Essential (primary) hypertension: Secondary | ICD-10-CM | POA: Diagnosis present

## 2022-12-25 DIAGNOSIS — R7989 Other specified abnormal findings of blood chemistry: Secondary | ICD-10-CM | POA: Diagnosis not present

## 2022-12-25 DIAGNOSIS — I110 Hypertensive heart disease with heart failure: Principal | ICD-10-CM | POA: Diagnosis present

## 2022-12-25 DIAGNOSIS — Z811 Family history of alcohol abuse and dependence: Secondary | ICD-10-CM | POA: Diagnosis not present

## 2022-12-25 DIAGNOSIS — I161 Hypertensive emergency: Principal | ICD-10-CM | POA: Diagnosis present

## 2022-12-25 DIAGNOSIS — Z809 Family history of malignant neoplasm, unspecified: Secondary | ICD-10-CM | POA: Diagnosis not present

## 2022-12-25 DIAGNOSIS — D696 Thrombocytopenia, unspecified: Secondary | ICD-10-CM

## 2022-12-25 DIAGNOSIS — I5021 Acute systolic (congestive) heart failure: Secondary | ICD-10-CM | POA: Diagnosis not present

## 2022-12-25 DIAGNOSIS — Z880 Allergy status to penicillin: Secondary | ICD-10-CM | POA: Diagnosis not present

## 2022-12-25 DIAGNOSIS — J90 Pleural effusion, not elsewhere classified: Secondary | ICD-10-CM | POA: Diagnosis not present

## 2022-12-25 DIAGNOSIS — Z87891 Personal history of nicotine dependence: Secondary | ICD-10-CM

## 2022-12-25 DIAGNOSIS — R079 Chest pain, unspecified: Secondary | ICD-10-CM | POA: Diagnosis not present

## 2022-12-25 DIAGNOSIS — I5022 Chronic systolic (congestive) heart failure: Secondary | ICD-10-CM | POA: Diagnosis present

## 2022-12-25 HISTORY — DX: Hyperlipidemia, unspecified: E78.5

## 2022-12-25 LAB — CBC
HCT: 45.6 % (ref 39.0–52.0)
Hemoglobin: 14.8 g/dL (ref 13.0–17.0)
MCH: 26.9 pg (ref 26.0–34.0)
MCHC: 32.5 g/dL (ref 30.0–36.0)
MCV: 82.9 fL (ref 80.0–100.0)
Platelets: 117 10*3/uL — ABNORMAL LOW (ref 150–400)
RBC: 5.5 MIL/uL (ref 4.22–5.81)
RDW: 15.1 % (ref 11.5–15.5)
WBC: 9 10*3/uL (ref 4.0–10.5)
nRBC: 0 % (ref 0.0–0.2)

## 2022-12-25 LAB — BASIC METABOLIC PANEL
Anion gap: 8 (ref 5–15)
BUN: 13 mg/dL (ref 6–20)
CO2: 23 mmol/L (ref 22–32)
Calcium: 8.8 mg/dL — ABNORMAL LOW (ref 8.9–10.3)
Chloride: 109 mmol/L (ref 98–111)
Creatinine, Ser: 1 mg/dL (ref 0.61–1.24)
GFR, Estimated: >60 mL/min (ref >60)
Glucose, Bld: 107 mg/dL — ABNORMAL HIGH (ref 70–99)
Potassium: 4 mmol/L (ref 3.5–5.1)
Sodium: 140 mmol/L (ref 135–145)

## 2022-12-25 LAB — ECHOCARDIOGRAM COMPLETE
AR max vel: 2.15 cm2
AV Area VTI: 2.04 cm2
AV Area mean vel: 2.02 cm2
AV Mean grad: 2 mm[Hg]
AV Peak grad: 3.4 mm[Hg]
Ao pk vel: 0.92 m/s
Area-P 1/2: 6.83 cm2
Calc EF: 30.1 %
Height: 72 in
S' Lateral: 5.2 cm
Single Plane A2C EF: 29.9 %
Single Plane A4C EF: 28.3 %
Weight: 3120 [oz_av]

## 2022-12-25 LAB — TROPONIN I (HIGH SENSITIVITY)
Troponin I (High Sensitivity): 53 ng/L — ABNORMAL HIGH (ref <18)
Troponin I (High Sensitivity): 58 ng/L — ABNORMAL HIGH (ref <18)

## 2022-12-25 LAB — BRAIN NATRIURETIC PEPTIDE: B Natriuretic Peptide: 969.6 pg/mL — ABNORMAL HIGH (ref 0.0–100.0)

## 2022-12-25 MED ORDER — HYDRALAZINE HCL 20 MG/ML IJ SOLN: 10.0000 mg | Freq: Once | INTRAMUSCULAR | Status: DC

## 2022-12-25 MED ORDER — ATORVASTATIN CALCIUM 40 MG PO TABS
40.0000 mg | ORAL_TABLET | Freq: Every day | ORAL | Status: DC
  Administered 2022-12-25 – 2022-12-26 (×2): 40 mg via ORAL
  Filled 2022-12-25 (×2): qty 1

## 2022-12-25 MED ORDER — SPIRONOLACTONE 25 MG PO TABS
25.0000 mg | ORAL_TABLET | Freq: Every day | ORAL | Status: DC
  Administered 2022-12-25 – 2022-12-26 (×2): 25 mg via ORAL
  Filled 2022-12-25 (×2): qty 1

## 2022-12-25 MED ORDER — OXYCODONE HCL 5 MG PO TABS: 5.0000 mg | ORAL_TABLET | ORAL | Status: DC | PRN

## 2022-12-25 MED ORDER — DAPAGLIFLOZIN PROPANEDIOL 10 MG PO TABS
10.0000 mg | ORAL_TABLET | Freq: Every day | ORAL | Status: DC
  Administered 2022-12-25 – 2022-12-26 (×2): 10 mg via ORAL
  Filled 2022-12-25 (×2): qty 1

## 2022-12-25 MED ORDER — FUROSEMIDE 10 MG/ML IJ SOLN
40.0000 mg | Freq: Two times a day (BID) | INTRAMUSCULAR | Status: DC
  Administered 2022-12-25 – 2022-12-26 (×2): 40 mg via INTRAVENOUS
  Filled 2022-12-25 (×2): qty 4

## 2022-12-25 MED ORDER — NITROGLYCERIN 2 % TD OINT
1.0000 [in_us] | TOPICAL_OINTMENT | Freq: Once | TRANSDERMAL | Status: AC
  Administered 2022-12-25: 1 [in_us] via TOPICAL
  Filled 2022-12-25: qty 1

## 2022-12-25 MED ORDER — SENNOSIDES-DOCUSATE SODIUM 8.6-50 MG PO TABS: 1.0000 | ORAL_TABLET | Freq: Every evening | ORAL | Status: DC | PRN

## 2022-12-25 MED ORDER — ACETAMINOPHEN 650 MG RE SUPP: 650.0000 mg | Freq: Four times a day (QID) | RECTAL | Status: DC | PRN

## 2022-12-25 MED ORDER — ACETAMINOPHEN 325 MG PO TABS
650.0000 mg | ORAL_TABLET | Freq: Four times a day (QID) | ORAL | Status: DC | PRN
  Administered 2022-12-25: 650 mg via ORAL
  Filled 2022-12-25: qty 2

## 2022-12-25 MED ORDER — FUROSEMIDE 10 MG/ML IJ SOLN
40.0000 mg | Freq: Once | INTRAMUSCULAR | Status: AC
  Administered 2022-12-25: 40 mg via INTRAVENOUS
  Filled 2022-12-25: qty 4

## 2022-12-25 MED ORDER — PROCHLORPERAZINE EDISYLATE 10 MG/2ML IJ SOLN: 5.0000 mg | Freq: Four times a day (QID) | INTRAMUSCULAR | Status: DC | PRN

## 2022-12-25 MED ORDER — ENOXAPARIN SODIUM 40 MG/0.4ML IJ SOSY
40.0000 mg | PREFILLED_SYRINGE | INTRAMUSCULAR | Status: DC
  Administered 2022-12-25 – 2022-12-26 (×2): 40 mg via SUBCUTANEOUS
  Filled 2022-12-25 (×2): qty 0.4

## 2022-12-25 MED ORDER — SODIUM CHLORIDE 0.9% FLUSH
3.0000 mL | Freq: Two times a day (BID) | INTRAVENOUS | Status: DC
  Administered 2022-12-25 – 2022-12-26 (×3): 3 mL via INTRAVENOUS

## 2022-12-25 MED ORDER — SACUBITRIL-VALSARTAN 24-26 MG PO TABS
1.0000 | ORAL_TABLET | Freq: Two times a day (BID) | ORAL | Status: DC
  Administered 2022-12-25 – 2022-12-26 (×2): 1 via ORAL
  Filled 2022-12-25 (×2): qty 1

## 2022-12-25 NOTE — Plan of Care (Signed)
  Problem: Education: Goal: Knowledge of General Education information will improve Description: Including pain rating scale, medication(s)/side effects and non-pharmacologic comfort measures Outcome: Progressing   Problem: Clinical Measurements: Goal: Will remain free from infection Outcome: Progressing Goal: Diagnostic test results will improve Outcome: Progressing   Problem: Activity: Goal: Risk for activity intolerance will decrease Outcome: Progressing   Problem: Nutrition: Goal: Adequate nutrition will be maintained Outcome: Progressing   Problem: Elimination: Goal: Will not experience complications related to bowel motility Outcome: Progressing Goal: Will not experience complications related to urinary retention Outcome: Progressing

## 2022-12-25 NOTE — Progress Notes (Signed)
Heart Failure Navigator Progress Note  Assessed for Heart & Vascular TOC clinic readiness.  Patient does not meet criteria due to Advanced Heart Failure Team patient of Dr. Sabharwal.   Navigator will sign off at this time.    Jaking Thayer, BSN, RN Heart Failure Nurse Navigator Secure Chat Only   

## 2022-12-25 NOTE — ED Triage Notes (Signed)
The pt has not been taking meds that he is supposed to take he has a physical exam in the am

## 2022-12-25 NOTE — Assessment & Plan Note (Signed)
Echocardiogram with reduced LV systolic function with EF 20 to 25%, global hypokinesis, mild LVH, RV systolic function preserved, LA and RA with moderate dilatation, no significant valvular disease.   Urine output is documented at 1,200 ml Systolic blood pressure is 120 to 140 mmHg.   Plan to continue diuresis with furosemide 40 mg IV bid.  Resume SGLT 2 inh, entresto, and spironolactone.  Prior to discharge will resume carvedilol.

## 2022-12-25 NOTE — ED Notes (Signed)
ED TO INPATIENT HANDOFF REPORT  ED Nurse Name and Phone #:  Theophilus Bones [phone]  S Name/Age/Gender John Velasquez 57 y.o. male Room/Bed: 009C/009C  Code Status   Code Status: Full Code  Home/SNF/Other Home Patient oriented to: self, place, time, and situation Is this baseline? Yes   Triage Complete: Triage complete  Chief Complaint Acute on chronic systolic (congestive) heart failure (HCC) [I50.23]  Triage Note The pt reports that he has had chest pain since yesterday  some sob    hx chf  The pt has not been taking meds that he is supposed to take he has a physical exam in the am   Allergies Allergies  Allergen Reactions   Penicillins Other (See Comments)    Unknown reaction    Level of Care/Admitting Diagnosis ED Disposition     ED Disposition  Admit   Condition  --   Comment  Hospital Area: [facility] [[id number]]  Level of Care: Telemetry Cardiac [[id number]]  May admit patient to [facility] or [facility] if equivalent level of care is available:: Yes  Covid Evaluation: Asymptomatic - no recent exposure (last 10 days) testing not required  Diagnosis: Acute on chronic systolic (congestive) heart failure [facility]) [[id number]]  Admitting Physician: Briscoe Deutscher [[id number]]  Attending Physician: Briscoe Deutscher [[id number]]  Certification:: I certify this patient will need inpatient services for at least 2 midnights  Expected Medical Readiness: 12/28/2022          B Medical/Surgery History Past Medical History:  Diagnosis Date   Cellulitis 04/13/2017   BILATERAL FEET   Hypertension    Past Surgical History:  Procedure Laterality Date   RIGHT/LEFT HEART CATH AND CORONARY ANGIOGRAPHY N/A 11/08/2021   Procedure: RIGHT/LEFT HEART CATH AND CORONARY ANGIOGRAPHY;  Surgeon: Dorthula Nettles, DO;  Location: [facility] INVASIVE CV LAB;  Service: Cardiovascular;  Laterality: N/A;   TONSILLECTOMY       A IV Location/Drains/Wounds Patient Lines/Drains/Airways  Status     Active Line/Drains/Airways     Name Placement date Placement time Site Days   Peripheral IV 12/25/22 20 G 1" Left Antecubital 12/25/22  0324  Antecubital  less than 1            Intake/Output Last 24 hours  Intake/Output Summary (Last 24 hours) at 12/25/2022 0734 Last data filed at 12/25/2022 0425 Gross per 24 hour  Intake --  Output 1200 ml  Net -1200 ml    Labs/Imaging Results for orders placed or performed during the hospital encounter of 12/25/22 (from the past 48 hour(s))  Basic metabolic panel     Status: Abnormal   Collection Time: 12/25/22  2:08 AM  Result Value Ref Range   Sodium 140 135 - 145 mmol/L   Potassium 4.0 3.5 - 5.1 mmol/L    Comment: HEMOLYSIS AT THIS LEVEL MAY AFFECT RESULT   Chloride 109 98 - 111 mmol/L   CO2 23 22 - 32 mmol/L   Glucose, Bld 107 (H) 70 - 99 mg/dL    Comment: Glucose reference range applies only to samples taken after fasting for at least 8 hours.   BUN 13 6 - 20 mg/dL   Creatinine, Ser 4.13 0.61 - 1.24 mg/dL   Calcium 8.8 (L) 8.9 - 10.3 mg/dL   GFR, Estimated >24 >40 mL/min    Comment: (NOTE) Calculated using the CKD-EPI Creatinine Equation (2021)    Anion gap 8 5 - 15    Comment: Performed at [facility], [address] [place], [place] [address]  CBC     Status: Abnormal   Collection Time: 12/25/22  2:08 AM  Result Value Ref Range   WBC 9.0 4.0 - 10.5 K/uL   RBC 5.50 4.22 - 5.81 MIL/uL   Hemoglobin 14.8 13.0 - 17.0 g/dL   HCT 53.6 64.4 - 03.4 %   MCV 82.9 80.0 - 100.0 fL   MCH 26.9 26.0 - 34.0 pg   MCHC 32.5 30.0 - 36.0 g/dL   RDW 74.2 59.5 - 63.8 %   Platelets 117 (L) 150 - 400 K/uL    Comment: REPEATED TO VERIFY   nRBC 0.0 0.0 - 0.2 %    Comment: Performed at [facility], [address] [place], [place] [address]  Troponin I (High Sensitivity)     Status: Abnormal   Collection Time: 12/25/22  2:08 AM  Result Value Ref Range   Troponin I (High Sensitivity) 53 (H) <18 ng/L    Comment:  (NOTE) Elevated high sensitivity troponin I (hsTnI) values and significant  changes across serial measurements may suggest ACS but many other  chronic and acute conditions are known to elevate hsTnI results.  Refer to the "Links" section for chest pain algorithms and additional  guidance. Performed at [facility], [address] [place], [place] [address]   Brain natriuretic peptide     Status: Abnormal   Collection Time: 12/25/22  2:53 AM  Result Value Ref Range   B Natriuretic Peptide 969.6 (H) 0.0 - 100.0 pg/mL    Comment: Performed at [facility], [address], [place], [place] [address]  Troponin I (High Sensitivity)     Status: Abnormal   Collection Time: 12/25/22  4:20 AM  Result Value Ref Range   Troponin I (High Sensitivity) 58 (H) <18 ng/L    Comment: (NOTE) Elevated high sensitivity troponin I (hsTnI) values and significant  changes across serial measurements may suggest ACS but many other  chronic and acute conditions are known to elevate hsTnI results.  Refer to the "Links" section for chest pain algorithms and additional  guidance. Performed at [facility], [address], [place], [place] [address]    DG Chest 2 View  Result Date: 12/25/2022 CLINICAL DATA:  Chest pain, CHF EXAM: CHEST - 2 VIEW COMPARISON:  None Available. FINDINGS: The lungs are symmetrically well expanded. Moderate bilateral perihilar interstitial pulmonary edema is present. Small right pleural effusion noted. Cardiac size is within normal limits. No pneumothorax. No acute bone abnormality. IMPRESSION: 1. Moderate interstitial pulmonary edema, likely cardiogenic in nature. Small right pleural effusion. Electronically Signed   By: Helyn Numbers M.D.   On: 12/25/2022 02:28    Pending Labs Unresulted Labs (From admission, onward)     Start     Ordered   01/01/23 0500  Creatinine, serum  (enoxaparin (LOVENOX)    CrCl >/= 30 ml/min)  Weekly,   R     Comments: while on  enoxaparin therapy    12/25/22 0416   12/26/22 0500  HIV Antibody (routine testing w rflx)  (HIV Antibody (Routine testing w reflex) panel)  Tomorrow morning,   R        12/25/22 0416   12/26/22 0500  Basic metabolic panel  Daily,   R      12/25/22 0416   12/26/22 0500  CBC  Daily,   R      12/25/22 0416   12/26/22 0500  Magnesium  Tomorrow morning,   R        12/25/22 0416            Vitals/Pain Today's Vitals   12/25/22 0315 12/25/22 0400 12/25/22 0615 12/25/22 0615  BP: (!) 152/138 (!) 161/118 (!) 140/101   Pulse: (!) 103 (!) 54 93   Resp: (!) 29 (!) 26 (!) 27   Temp:    97.9 F (36.6 C)  TempSrc:    Oral  SpO2: 95% 96% 94%   Weight:      Height:      PainSc:        Isolation Precautions No active isolations  Medications Medications  enoxaparin (LOVENOX) injection 40 mg (has no administration in time range)  furosemide (LASIX) injection 40 mg (has no administration in time range)  sodium chloride flush (NS) 0.9 % injection 3 mL (has no administration in time range)  acetaminophen (TYLENOL) tablet 650 mg (has no administration in time range)    Or  acetaminophen (TYLENOL) suppository 650 mg (has no administration in time range)  oxyCODONE (Oxy IR/ROXICODONE) immediate release tablet 5 mg (has no administration in time range)  senna-docusate (Senokot-S) tablet 1 tablet (has no administration in time range)  prochlorperazine (COMPAZINE) injection 5 mg (has no administration in time range)  nitroGLYCERIN (NITROGLYN) 2 % ointment 1 inch (1 inch Topical Given 12/25/22 0320)  furosemide (LASIX) injection 40 mg (40 mg Intravenous Given 12/25/22 0328)    Mobility walks     Focused Assessments Cardiac Assessment Handoff:  Cardiac Rhythm: Sinus tachycardia No results found for: "CKTOTAL", "CKMB", "CKMBINDEX", "TROPONINI" Lab Results  Component Value Date   DDIMER 0.63 (H) 11/06/2021   Does the Patient currently have chest pain? No    R Recommendations: See  Admitting Provider Note  Report given to:   Additional Notes:

## 2022-12-25 NOTE — Assessment & Plan Note (Signed)
Sable cell count follow up as outpatient.

## 2022-12-25 NOTE — H&P (Signed)
History and Physical    John Velasquez DOB: [birth year] DOA: 12/25/2022  PCP: Renford Dills, MD   Patient coming from: Home   Chief Complaint: SOB, chest pain, orthopnea   HPI: John Velasquez is a 57 y.o. male with medical history significant for uncontrolled hypertension and chronic systolic CHF who presents with chest pain, shortness of breath, and orthopnea.   Patient has not filled any of his prescriptions since January of this year due to financial constraints.  He had been feeling well until the morning of 12/23/2022 when he developed shortness of breath.  That night, he was severely dyspneic when trying to lay down to sleep.  He woke multiple times with breathlessness.  He continues to feel short of breath and has a sensation of "tightness" in his chest.  He denies any leg swelling, fever, chills, or significant cough.  ED Course: Upon arrival to the ED, patient is found to be afebrile and saturating well on room air with mild tachycardia and elevated blood pressure.  EKG demonstrates sinus tachycardia with rate 106, LAFB, and LVH.  Chest x-ray concerning for interstitial pulmonary edema and small right pleural effusion.  Labs are most notable for normal electrolytes, normal renal function, troponin 53, BNP 970, and platelets 117,000.  Patient was treated with 40 mg IV Lasix and 1 inch nitroglycerin ointment in the ED.  Review of Systems:  All other systems reviewed and apart from HPI, are negative.  Past Medical History:  Diagnosis Date   Cellulitis 04/13/2017   BILATERAL FEET   Hypertension     Past Surgical History:  Procedure Laterality Date   RIGHT/LEFT HEART CATH AND CORONARY ANGIOGRAPHY N/A 11/08/2021   Procedure: RIGHT/LEFT HEART CATH AND CORONARY ANGIOGRAPHY;  Surgeon: Dorthula Nettles, DO;  Location: [facility];  Service: Cardiovascular;  Laterality: N/A;   TONSILLECTOMY      Social History:   reports that he quit smoking about 22 months  ago. His smoking use included cigarettes. He started smoking about 40 years ago. He has a 39 pack-year smoking history. He has never used smokeless tobacco. He reports that he does not drink alcohol and does not use drugs.  Allergies  Allergen Reactions   Penicillins Other (See Comments)    Unknown reaction    Family History  Problem Relation Age of Onset   Cancer Maternal Grandmother        unknown   Cancer Paternal Grandmother        unknown   Heart disease Mother    Hypertension Mother    Cancer Father        throat   Alcohol abuse Father      Prior to Admission medications   Medication Sig Start Date End Date Taking? Authorizing Provider  atorvastatin (LIPITOR) 40 MG tablet Take 1 tablet (40 mg total) by mouth daily. 12/09/21   Sabharwal, Aditya, DO  carvedilol (COREG) 6.25 MG tablet Take 1 tablet (6.25 mg total) by mouth 2 (two) times daily with a meal. 11/18/21   Sabharwal, Aditya, DO  dapagliflozin propanediol (FARXIGA) 10 MG TABS tablet Take 1 tablet (10 mg total) by mouth daily. 12/09/21   Sabharwal, Aditya, DO  furosemide (LASIX) 20 MG tablet Take 20 mg by mouth daily.    [provider]  sacubitril-valsartan (ENTRESTO) 97-103 MG Take 1 tablet by mouth 2 (two) times daily. 12/09/21   Sabharwal, Aditya, DO  spironolactone (ALDACTONE) 25 MG tablet Take 1 tablet (25 mg total) by mouth  daily. 12/09/21   Dorthula Nettles, DO    Physical Exam: Vitals:   12/25/22 0159 12/25/22 0201 12/25/22 0315 12/25/22 0400  BP:  (!) 161/121 (!) 152/138 (!) 161/118  Pulse:  (!) 111 (!) 103 (!) 54  Resp:  18 (!) 29 (!) 26  Temp:  97.9 F (36.6 C)    SpO2:  99% 95% 96%  Weight: 88.5 kg     Height: 6' (1.829 m)       Constitutional: NAD, calm  Eyes: PERTLA, lids and conjunctivae normal ENMT: Mucous membranes are moist. Posterior pharynx clear of any exudate or lesions.   Neck: supple, no masses  Respiratory: Fine rales b/l. No accessory muscle use.  Cardiovascular: S1 &  S2 heard, regular rate and rhythm. No extremity edema. JVD present. Abdomen: No distension, no tenderness, soft. Bowel sounds active.  Musculoskeletal: no clubbing / cyanosis. No joint deformity upper and lower extremities.   Skin: no significant rashes, lesions, ulcers. Warm, dry, well-perfused. Neurologic: CN 2-12 grossly intact. Moving all extremities. Alert and oriented.  Psychiatric: Pleasant. Cooperative.    Labs and Imaging on Admission: I have personally reviewed following labs and imaging studies  CBC: Recent Labs  Lab 12/25/22 0208  WBC 9.0  HGB 14.8  HCT 45.6  MCV 82.9  PLT 117*   Basic Metabolic Panel: Recent Labs  Lab 12/25/22 0208  NA 140  K 4.0  CL 109  CO2 23  GLUCOSE 107*  BUN 13  CREATININE 1.00  CALCIUM 8.8*   GFR: Estimated Creatinine Clearance: 89.5 mL/min (by C-G formula based on SCr of 1 mg/dL). Liver Function Tests: No results for input(s): "AST", "ALT", "ALKPHOS", "BILITOT", "PROT", "ALBUMIN" in the last 168 hours. No results for input(s): "LIPASE", "AMYLASE" in the last 168 hours. No results for input(s): "AMMONIA" in the last 168 hours. Coagulation Profile: No results for input(s): "INR", "PROTIME" in the last 168 hours. Cardiac Enzymes: No results for input(s): "CKTOTAL", "CKMB", "CKMBINDEX", "TROPONINI" in the last 168 hours. BNP (last 3 results) No results for input(s): "PROBNP" in the last 8760 hours. HbA1C: No results for input(s): "HGBA1C" in the last 72 hours. CBG: No results for input(s): "GLUCAP" in the last 168 hours. Lipid Profile: No results for input(s): "CHOL", "HDL", "LDLCALC", "TRIG", "CHOLHDL", "LDLDIRECT" in the last 72 hours. Thyroid Function Tests: No results for input(s): "TSH", "T4TOTAL", "FREET4", "T3FREE", "THYROIDAB" in the last 72 hours. Anemia Panel: No results for input(s): "VITAMINB12", "FOLATE", "FERRITIN", "TIBC", "IRON", "RETICCTPCT" in the last 72 hours. Urine analysis: No results found for:  "COLORURINE", "APPEARANCEUR", "LABSPEC", "PHURINE", "GLUCOSEU", "HGBUR", "BILIRUBINUR", "KETONESUR", "PROTEINUR", "UROBILINOGEN", "NITRITE", "LEUKOCYTESUR" Sepsis Labs: @LABRCNTIP (procalcitonin:4,lacticidven:4) )No results found for this or any previous visit (from the past 240 hour(s)).   Radiological Exams on Admission: DG Chest 2 View  Result Date: 12/25/2022 CLINICAL DATA:  Chest pain, CHF EXAM: CHEST - 2 VIEW COMPARISON:  None Available. FINDINGS: The lungs are symmetrically well expanded. Moderate bilateral perihilar interstitial pulmonary edema is present. Small right pleural effusion noted. Cardiac size is within normal limits. No pneumothorax. No acute bone abnormality. IMPRESSION: 1. Moderate interstitial pulmonary edema, likely cardiogenic in nature. Small right pleural effusion. Electronically Signed   By: Helyn Numbers M.D.   On: 12/25/2022 02:28    EKG: Independently reviewed. Sinus tachycardia, rate 106, LAFB, LVH.   Assessment/Plan  1. Acute on chronic HFrEF  - Continue diuresis with IV Lasix, monitor weight and I/Os, monitor renal function and electrolytes, update echocardiogram, reintroduce the GDMTs as tolerated  2. Elevated troponin  - Initial HS-troponin is 53  - There was no significant CAD on cath in October 2023 and this is likely from decompensated CHF rather than ACS  - Treat CHF as above, trend troponin   3. Hypertensive urgency  - Nitroglycerin ointment placed in ED  - Anticipate improvement with diuresis    4. Thrombocytopenia  - Appears stable    DVT prophylaxis: Lovenox  Code Status: Full  Level of Care: Level of care: Telemetry Cardiac Family Communication: None present   Disposition Plan:  Patient is from: Home  Anticipated d/c is to: Home  Anticipated d/c date is: 12/29/22  Patient currently: Pending improved volume status, echocardiogram  Consults called: None  Admission status: Inpatient    Briscoe Deutscher, MD [facility]  12/25/2022, 4:16 AM

## 2022-12-25 NOTE — Assessment & Plan Note (Signed)
Resume statin therapy.

## 2022-12-25 NOTE — Progress Notes (Signed)
Pt voiding in toilet after being reminded to use urinal for accurate output. Pt reported 4 unmeasured occurrences, stated he would now use the urinal.

## 2022-12-25 NOTE — ED Notes (Signed)
Pt. Ambulated to bathroom with steady gait without distress or chest pain.

## 2022-12-25 NOTE — ED Notes (Signed)
ED TO INPATIENT HANDOFF REPORT  ED Nurse Name and Phone #: Forde Dandy [phone]  S Name/Age/Gender John Velasquez 57 y.o. male Room/Bed: 045C/045C  Code Status   Code Status: Full Code  Home/SNF/Other Home Patient oriented to: self, place, time, and situation Is this baseline? Yes   Triage Complete: Triage complete  Chief Complaint Acute on chronic systolic (congestive) heart failure (HCC) [I50.23]  Triage Note The pt reports that he has had chest pain since yesterday  some sob    hx chf  The pt has not been taking meds that he is supposed to take he has a physical exam in the am   Allergies Allergies  Allergen Reactions   Penicillins Other (See Comments)    Unknown reaction    Level of Care/Admitting Diagnosis ED Disposition     ED Disposition  Admit   Condition  --   Comment  Hospital Area: [facility] [[id number]]  Level of Care: Telemetry Cardiac [[id number]]  May admit patient to [facility] or [facility] if equivalent level of care is available:: Yes  Covid Evaluation: Asymptomatic - no recent exposure (last 10 days) testing not required  Diagnosis: Acute on chronic systolic (congestive) heart failure [facility]) [[id number]]  Admitting Physician: Briscoe Deutscher [[id number]]  Attending Physician: Briscoe Deutscher [[id number]]  Certification:: I certify this patient will need inpatient services for at least 2 midnights  Expected Medical Readiness: 12/28/2022          B Medical/Surgery History Past Medical History:  Diagnosis Date   Cellulitis 04/13/2017   BILATERAL FEET   Hypertension    Past Surgical History:  Procedure Laterality Date   RIGHT/LEFT HEART CATH AND CORONARY ANGIOGRAPHY N/A 11/08/2021   Procedure: RIGHT/LEFT HEART CATH AND CORONARY ANGIOGRAPHY;  Surgeon: Dorthula Nettles, DO;  Location: [facility];  Service: Cardiovascular;  Laterality: N/A;   TONSILLECTOMY       A IV Location/Drains/Wounds Patient Lines/Drains/Airways Status      Active Line/Drains/Airways     Name Placement date Placement time Site Days   Peripheral IV 12/25/22 20 G 1" Left Antecubital 12/25/22  0324  Antecubital  less than 1            Intake/Output Last 24 hours  Intake/Output Summary (Last 24 hours) at 12/25/2022 1304 Last data filed at 12/25/2022 0425 Gross per 24 hour  Intake --  Output 1200 ml  Net -1200 ml    Labs/Imaging Results for orders placed or performed during the hospital encounter of 12/25/22 (from the past 48 hour(s))  Basic metabolic panel     Status: Abnormal   Collection Time: 12/25/22  2:08 AM  Result Value Ref Range   Sodium 140 135 - 145 mmol/L   Potassium 4.0 3.5 - 5.1 mmol/L    Comment: HEMOLYSIS AT THIS LEVEL MAY AFFECT RESULT   Chloride 109 98 - 111 mmol/L   CO2 23 22 - 32 mmol/L   Glucose, Bld 107 (H) 70 - 99 mg/dL    Comment: Glucose reference range applies only to samples taken after fasting for at least 8 hours.   BUN 13 6 - 20 mg/dL   Creatinine, Ser 8.65 0.61 - 1.24 mg/dL   Calcium 8.8 (L) 8.9 - 10.3 mg/dL   GFR, Estimated >78 >46 mL/min    Comment: (NOTE) Calculated using the CKD-EPI Creatinine Equation (2021)    Anion gap 8 5 - 15    Comment: Performed at [facility], [address] [place], [place] [address]  CBC     Status: Abnormal   Collection Time: 12/25/22  2:08 AM  Result Value Ref Range   WBC 9.0 4.0 - 10.5 K/uL   RBC 5.50 4.22 - 5.81 MIL/uL   Hemoglobin 14.8 13.0 - 17.0 g/dL   HCT 13.0 86.5 - 78.4 %   MCV 82.9 80.0 - 100.0 fL   MCH 26.9 26.0 - 34.0 pg   MCHC 32.5 30.0 - 36.0 g/dL   RDW 69.6 29.5 - 28.4 %   Platelets 117 (L) 150 - 400 K/uL    Comment: REPEATED TO VERIFY   nRBC 0.0 0.0 - 0.2 %    Comment: Performed at [facility], [address], [place], [place] [address]  Troponin I (High Sensitivity)     Status: Abnormal   Collection Time: 12/25/22  2:08 AM  Result Value Ref Range   Troponin I (High Sensitivity) 53 (H) <18 ng/L    Comment:  (NOTE) Elevated high sensitivity troponin I (hsTnI) values and significant  changes across serial measurements may suggest ACS but many other  chronic and acute conditions are known to elevate hsTnI results.  Refer to the "Links" section for chest pain algorithms and additional  guidance. Performed at [facility], [address] [place], [place] [address]   Brain natriuretic peptide     Status: Abnormal   Collection Time: 12/25/22  2:53 AM  Result Value Ref Range   B Natriuretic Peptide 969.6 (H) 0.0 - 100.0 pg/mL    Comment: Performed at [facility], [address], [place], [place] [address]  Troponin I (High Sensitivity)     Status: Abnormal   Collection Time: 12/25/22  4:20 AM  Result Value Ref Range   Troponin I (High Sensitivity) 58 (H) <18 ng/L    Comment: (NOTE) Elevated high sensitivity troponin I (hsTnI) values and significant  changes across serial measurements may suggest ACS but many other  chronic and acute conditions are known to elevate hsTnI results.  Refer to the "Links" section for chest pain algorithms and additional  guidance. Performed at [facility], [address], [place], [place] [address]    DG Chest 2 View  Result Date: 12/25/2022 CLINICAL DATA:  Chest pain, CHF EXAM: CHEST - 2 VIEW COMPARISON:  None Available. FINDINGS: The lungs are symmetrically well expanded. Moderate bilateral perihilar interstitial pulmonary edema is present. Small right pleural effusion noted. Cardiac size is within normal limits. No pneumothorax. No acute bone abnormality. IMPRESSION: 1. Moderate interstitial pulmonary edema, likely cardiogenic in nature. Small right pleural effusion. Electronically Signed   By: Helyn Numbers M.D.   On: 12/25/2022 02:28    Pending Labs Unresulted Labs (From admission, onward)     Start     Ordered   01/01/23 0500  Creatinine, serum  (enoxaparin (LOVENOX)    CrCl >/= 30 ml/min)  Weekly,   R     Comments: while on  enoxaparin therapy    12/25/22 0416   12/26/22 0500  HIV Antibody (routine testing w rflx)  (HIV Antibody (Routine testing w reflex) panel)  Tomorrow morning,   R        12/25/22 0416   12/26/22 0500  Basic metabolic panel  Daily,   R      12/25/22 0416   12/26/22 0500  CBC  Daily,   R      12/25/22 0416   12/26/22 0500  Magnesium  Tomorrow morning,   R        12/25/22 0416            Vitals/Pain Today's Vitals   12/25/22 0830 12/25/22 0915 12/25/22 1000 12/25/22 1047  BP: (!) 123/104 119/87 114/83   Pulse: 74 76 83   Resp: 19 (!) 23 19   Temp:    98 F (36.7 C)  TempSrc:    Oral  SpO2: 95% 94% 93%   Weight:      Height:      PainSc:        Isolation Precautions No active isolations  Medications Medications  enoxaparin (LOVENOX) injection 40 mg (40 mg Subcutaneous Given 12/25/22 0943)  furosemide (LASIX) injection 40 mg (has no administration in time range)  sodium chloride flush (NS) 0.9 % injection 3 mL (3 mLs Intravenous Given 12/25/22 0944)  acetaminophen (TYLENOL) tablet 650 mg (has no administration in time range)    Or  acetaminophen (TYLENOL) suppository 650 mg (has no administration in time range)  oxyCODONE (Oxy IR/ROXICODONE) immediate release tablet 5 mg (has no administration in time range)  senna-docusate (Senokot-S) tablet 1 tablet (has no administration in time range)  prochlorperazine (COMPAZINE) injection 5 mg (has no administration in time range)  nitroGLYCERIN (NITROGLYN) 2 % ointment 1 inch (1 inch Topical Given 12/25/22 0320)  furosemide (LASIX) injection 40 mg (40 mg Intravenous Given 12/25/22 0328)    Mobility walks     Focused Assessments Cardiac Assessment Handoff:  Cardiac Rhythm: Sinus tachycardia No results found for: "CKTOTAL", "CKMB", "CKMBINDEX", "TROPONINI" Lab Results  Component Value Date   DDIMER 0.63 (H) 11/06/2021   Does the Patient currently have chest pain? No    R Recommendations: See Admitting Provider  Note  Report given to:   Additional Notes: .

## 2022-12-25 NOTE — Hospital Course (Addendum)
Mr. Huebner was admitted to the hospital with the working diagnosis of heart failure decompensation.  57 yo male with the past medical history of hypertension and heart failure who presented with chest pain, dyspnea and orthopnea. Reported 3 days of worsening symptoms, not able to sleep due to orthopnea and PND. No lower extremity edema. He has been off medications for the last 10 months due to cost. On his initial physical examination his blood pressure was 161/121, HR 111. RR 18 and 02 saturation 95%, lungs with rales bilaterally, with no wheezing, heart with S1 and S2 present and regular, positive JVD, abdomen with no distention and no lower extremity edema.  Na 140, K 4,0 Cl 109, bicarbonate 23, glucose 107 bun 13 cr 1,0  BNP 969  High sensitive troponin 53 and 58  Wbc 9.0 hgb 14.8 plt 117    Chest radiograph with with cardiomegaly with bilateral hilar vascular congestion with bilateral central interstitial infiltrates.   EKG 106 left axis deviation, normal intervals, sinus rhythm with bi atrial enlargement, with no significant ST segment or T wave changes.   Patient was placed on IV furosemide for diuresis and de congestion.  Resumed guideline directed medical therapy with good toleration.  Will need close follow up as outpatient.

## 2022-12-25 NOTE — Assessment & Plan Note (Signed)
Uncontrolled hypertension on admission due to volume overload.   Plan to continue blood pressure control with entresto, and spironolactone.  Will resume carvedilol prior to discharge.

## 2022-12-25 NOTE — Progress Notes (Addendum)
  Progress Note   Patient: John Velasquez ZOX:[id number] DOB: [birth year] DOA: 12/25/2022     0 DOS: the patient was seen and examined on 12/25/2022   Brief hospital course: Mr. Towne was admitted to the hospital with the working diagnosis of heart failure decompensation.  57 yo male with the past medical history of hypertension and heart failure who presented with chest pain, dyspnea and orthopnea. Reported 3 days of worsening symptoms, not able to sleep due to orthopnea and PND. No lower extremity edema. He has been off medications for the last 10 months due to cost. On his initial physical examination his blood pressure was 161/121, HR 111. RR 18 and 02 saturation 95%, lungs with rales bilaterally, with no wheezing, heart with S1 and S2 present and regular, positive JVD, abdomen with no distention and no lower extremity edema.  Na 140, K 4,0 Cl 109, bicarbonate 23, glucose 107 bun 13 cr 1,0  BNP 969  High sensitive troponin 53 and 58  Wbc 9.0 hgb 14.8 plt 117    Chest radiograph with with cardiomegaly with bilateral hilar vascular congestion with bilateral central interstitial infiltrates.   EKG 106 left axis deviation, normal intervals, sinus rhythm with bi atrial enlargement, with no significant ST segment or T wave changes.   Assessment and Plan: * Acute on chronic systolic (congestive) heart failure (HCC) Echocardiogram with reduced LV systolic function with EF 20 to 25%, global hypokinesis, mild LVH, RV systolic function preserved, LA and RA with moderate dilatation, no significant valvular disease.   Urine output is documented at 1,200 ml Systolic blood pressure is 120 to 140 mmHg.   Plan to continue diuresis with furosemide 40 mg IV bid.  Resume SGLT 2 inh, entresto, and spironolactone.  Prior to discharge will resume carvedilol.   HTN (hypertension), benign Uncontrolled hypertension on admission due to volume overload.   Plan to continue blood pressure control with  entresto, and spironolactone.  Will resume carvedilol prior to discharge.   Thrombocytopenia (HCC) Sable cell count follow up as outpatient.   Dyslipidemia Resume statin therapy.    Subjective: Patient is feeling better but not yet back to baseline, no chest pain,   Physical Exam: Vitals:   12/25/22 1000 12/25/22 1047 12/25/22 1308 12/25/22 1431  BP: 114/83  (!) 120/98 (!) 121/99  Pulse: 83  86 (!) 43  Resp: 19  (!) 28 20  Temp:  98 F (36.7 C)  97.9 F (36.6 C)  TempSrc:  Oral  Oral  SpO2: 93%  96% 100%  Weight:      Height:       Neurology awake and alert ENT with no pallor  Cardiovascular with S1 and S2 present and regular, no gallops, rubs or murmurs Mild JVD No lower extremity edema  Respiratory with mild rales at bases with no wheezing or rhonchi Abdomen with no distention or abdominal wall edema  Data Reviewed:    Family Communication: no family at the bedside   Disposition: Status is: Inpatient Remains inpatient appropriate because: IV diuresis   Planned Discharge Destination: Home     Author: Coralie Keens, MD 12/25/2022 2:50 PM  For on call review [email or web address].

## 2022-12-25 NOTE — ED Provider Notes (Signed)
[place] EMERGENCY DEPARTMENT AT [facility] Provider Note   CSN: [id number] Arrival date & time: 12/25/22  0146     History  Chief Complaint  Patient presents with   Chest Pain    John Velasquez is a 57 y.o. male.  57 year old male with complaint of SHOB, orthopnea and chest tightness. Symptoms started around 5:30am this morning, waking him from his sleep. Progressively worse through the day. Tonight, tried to walk outside in the cold air which did help but ultimately called 911. History of CHF, HTN, not compliant with meds. Admitted last year with new diagnosis CHF, followed up with cardiology and was compliant with meds initially until he went to refill once and the cost was significantly higher and he didn't pick up the Rx. Daily smoker, decided to quit today with his symptoms.        Home Medications Prior to Admission medications   Medication Sig Start Date End Date Taking? Authorizing Provider  atorvastatin (LIPITOR) 40 MG tablet Take 1 tablet (40 mg total) by mouth daily. 12/09/21   Sabharwal, Aditya, DO  carvedilol (COREG) 6.25 MG tablet Take 1 tablet (6.25 mg total) by mouth 2 (two) times daily with a meal. 11/18/21   Sabharwal, Aditya, DO  dapagliflozin propanediol (FARXIGA) 10 MG TABS tablet Take 1 tablet (10 mg total) by mouth daily. 12/09/21   Sabharwal, Aditya, DO  furosemide (LASIX) 20 MG tablet Take 20 mg by mouth daily.    [provider]  sacubitril-valsartan (ENTRESTO) 97-103 MG Take 1 tablet by mouth 2 (two) times daily. 12/09/21   Sabharwal, Aditya, DO  spironolactone (ALDACTONE) 25 MG tablet Take 1 tablet (25 mg total) by mouth daily. 12/09/21   Sabharwal, Eliezer Lofts, DO      Allergies    Penicillins    Review of Systems   Review of Systems Negative except as per HPI Physical Exam Updated Vital Signs BP (!) 161/118   Pulse (!) 54   Temp 97.9 F (36.6 C)   Resp (!) 26   Ht 6' (1.829 m)   Wt 88.5 kg   SpO2 96%   BMI 26.45 kg/m   Physical Exam Vitals and nursing note reviewed.  Constitutional:      General: He is not in acute distress.    Appearance: He is well-developed. He is not diaphoretic.  HENT:     Head: Normocephalic and atraumatic.  Cardiovascular:     Rate and Rhythm: Regular rhythm. Tachycardia present.  Pulmonary:     Effort: Tachypnea present.     Breath sounds: Examination of the right-lower field reveals decreased breath sounds. Examination of the left-lower field reveals decreased breath sounds. Decreased breath sounds present. No wheezing.  Abdominal:     Palpations: Abdomen is soft.     Tenderness: There is no abdominal tenderness.  Musculoskeletal:     Cervical back: Neck supple.     Right lower leg: No tenderness. No edema.     Left lower leg: No tenderness. No edema.  Skin:    General: Skin is warm and dry.     Findings: No erythema or rash.  Neurological:     Mental Status: He is alert and oriented to person, place, and time.  Psychiatric:        Behavior: Behavior normal.     ED Results / Procedures / Treatments   Labs (all labs ordered are listed, but only abnormal results are displayed) Labs Reviewed  BASIC METABOLIC PANEL -  Abnormal; Notable for the following components:      Result Value   Glucose, Bld 107 (*)    Calcium 8.8 (*)    All other components within normal limits  CBC - Abnormal; Notable for the following components:   Platelets 117 (*)    All other components within normal limits  BRAIN NATRIURETIC PEPTIDE - Abnormal; Notable for the following components:   B Natriuretic Peptide 969.6 (*)    All other components within normal limits  TROPONIN I (HIGH SENSITIVITY) - Abnormal; Notable for the following components:   Troponin I (High Sensitivity) 53 (*)    All other components within normal limits  TROPONIN I (HIGH SENSITIVITY)    EKG EKG Interpretation Date/Time:  Monday December 25 2022 02:26:22 EST Ventricular Rate:  106 PR Interval:  162 QRS  Duration:  102 QT Interval:  368 QTC Calculation: 488 R Axis:   -49  Text Interpretation: Sinus tachycardia Biatrial enlargement Left anterior fascicular block Left ventricular hypertrophy ( Cornell product ) Cannot rule out Inferior infarct (masked by fascicular block?) , age undetermined Abnormal ECG When compared with ECG of 18-Nov-2021 10:52, PREVIOUS ECG IS PRESENT Confirmed by Drema Pry [id number]) on 12/25/2022 2:57:43 AM  Radiology DG Chest 2 View  Result Date: 12/25/2022 CLINICAL DATA:  Chest pain, CHF EXAM: CHEST - 2 VIEW COMPARISON:  None Available. FINDINGS: The lungs are symmetrically well expanded. Moderate bilateral perihilar interstitial pulmonary edema is present. Small right pleural effusion noted. Cardiac size is within normal limits. No pneumothorax. No acute bone abnormality. IMPRESSION: 1. Moderate interstitial pulmonary edema, likely cardiogenic in nature. Small right pleural effusion. Electronically Signed   By: Helyn Numbers M.D.   On: 12/25/2022 02:28    Procedures .Critical Care  Performed by: Jeannie Fend, PA-C Authorized by: Jeannie Fend, PA-C   Critical care provider statement:    Critical care time (minutes):  30   Critical care was time spent personally by me on the following activities:  Development of treatment plan with patient or surrogate, discussions with consultants, evaluation of patient's response to treatment, examination of patient, ordering and review of laboratory studies, ordering and review of radiographic studies, ordering and performing treatments and interventions, pulse oximetry, re-evaluation of patient's condition and review of old charts     Medications Ordered in ED Medications  nitroGLYCERIN (NITROGLYN) 2 % ointment 1 inch (1 inch Topical Given 12/25/22 0320)  furosemide (LASIX) injection 40 mg (40 mg Intravenous Given 12/25/22 0328)    ED Course/ Medical Decision Making/ A&P                                 Medical  Decision Making  This patient presents to the ED for concern of SHOB, chest tightness, this involves an extensive number of treatment options, and is a complaint that carries with it a high risk of complications and morbidity.  The differential diagnosis includes but not limited to CHF, ACS, PE, htn emergency    Co morbidities that complicate the patient evaluation  CHF, HTN, tobacco use, HLD   Additional history obtained:  External records from outside source obtained and reviewed including echo dated 01/16/22 with EF 35%   Lab Tests:  I Ordered, and personally interpreted labs.  The pertinent results include:  troponin 53, similar to prior on file, will trend. CBC WNL. BMP with no significant findings. D-dimer elevated at 969.6   Imaging Studies  ordered:  I ordered imaging studies including CXR  I independently visualized and interpreted imaging which showed pulmonary edema  I agree with the radiologist interpretation   Cardiac Monitoring: / EKG:  The patient was maintained on a cardiac monitor.  I personally viewed and interpreted the cardiac monitored which showed an underlying rhythm of: Sinus tachycardia, rate 106   Consultations Obtained:  I requested consultation with the ER attending, Dr. Eudelia Bunch,  and discussed lab and imaging findings as well as pertinent plan - they recommend: lasix, hydralazine, nitroglycerin paste. Admit to hospitalist service for hypertensive emergency  Case discussed with Dr. Antionette Char with [facility] hospitalist service will consult for admission.   Problem List / ED Course / Critical interventions / Medication management  57 year old male presents with shortness of breath and chest tightness.  History of CHF, noncompliant with medications as discussed above.  On exam, he is tachypneic, tachycardic, hypertensive and appears uncomfortable.  No significant lower extremity edema.  Chest x-ray consistent with vascular congestion, pulmonary edema.  Concern  for hypertensive emergency.  Patient is provided with hydralazine, Nitropaste, Lasix.  Case discussed with hospital service who will consult for admission. I ordered medication including hydralazine, nitroglycerin paste, lasix for CHF, hypertensive emergency  Reevaluation of the patient after these medicines showed that the patient improved I have reviewed the patients home medicines and have made adjustments as needed   Social Determinants of Health:  Non compliant, has PCP   Test / Admission - Considered:  admit         Final Clinical Impression(s) / ED Diagnoses Final diagnoses:  Hypertensive emergency  Acute on chronic congestive heart failure, unspecified heart failure type [facility])    Rx / DC Orders ED Discharge Orders     None         Jeannie Fend, PA-C 12/25/22 0413    Nira Conn, MD 12/25/22 0800

## 2022-12-25 NOTE — ED Triage Notes (Signed)
The pt reports that he has had chest pain since yesterday  some sob    hx chf

## 2022-12-25 NOTE — ED Notes (Signed)
HIV labs sent

## 2022-12-25 NOTE — Progress Notes (Signed)
  Echocardiogram 2D Echocardiogram has been performed.  John Velasquez 12/25/2022, 1:46 PM

## 2022-12-26 ENCOUNTER — Other Ambulatory Visit (HOSPITAL_COMMUNITY): Payer: Self-pay

## 2022-12-26 DIAGNOSIS — E785 Hyperlipidemia, unspecified: Secondary | ICD-10-CM | POA: Diagnosis not present

## 2022-12-26 DIAGNOSIS — D696 Thrombocytopenia, unspecified: Secondary | ICD-10-CM | POA: Diagnosis not present

## 2022-12-26 DIAGNOSIS — I5023 Acute on chronic systolic (congestive) heart failure: Secondary | ICD-10-CM | POA: Diagnosis not present

## 2022-12-26 DIAGNOSIS — I10 Essential (primary) hypertension: Secondary | ICD-10-CM | POA: Diagnosis not present

## 2022-12-26 LAB — BASIC METABOLIC PANEL
Anion gap: 9 (ref 5–15)
BUN: 15 mg/dL (ref 6–20)
CO2: 24 mmol/L (ref 22–32)
Calcium: 8.8 mg/dL — ABNORMAL LOW (ref 8.9–10.3)
Chloride: 105 mmol/L (ref 98–111)
Creatinine, Ser: 1.12 mg/dL (ref 0.61–1.24)
GFR, Estimated: >60 mL/min (ref >60)
Glucose, Bld: 99 mg/dL (ref 70–99)
Potassium: 3.4 mmol/L — ABNORMAL LOW (ref 3.5–5.1)
Sodium: 138 mmol/L (ref 135–145)

## 2022-12-26 LAB — HIV ANTIBODY (ROUTINE TESTING W REFLEX): HIV Screen 4th Generation wRfx: NONREACTIVE

## 2022-12-26 LAB — MAGNESIUM: Magnesium: 2.1 mg/dL (ref 1.7–2.4)

## 2022-12-26 MED ORDER — FUROSEMIDE 20 MG PO TABS
20.0000 mg | ORAL_TABLET | Freq: Every day | ORAL | 0 refills | Status: DC
  Filled 2022-12-26: qty 30, 30d supply, fill #0

## 2022-12-26 MED ORDER — POTASSIUM CHLORIDE CRYS ER 20 MEQ PO TBCR
40.0000 meq | EXTENDED_RELEASE_TABLET | ORAL | Status: AC
  Administered 2022-12-26 (×2): 40 meq via ORAL
  Filled 2022-12-26 (×2): qty 2

## 2022-12-26 MED ORDER — DAPAGLIFLOZIN PROPANEDIOL 10 MG PO TABS
10.0000 mg | ORAL_TABLET | Freq: Every day | ORAL | 0 refills | Status: DC
  Filled 2022-12-26: qty 30, 30d supply, fill #0

## 2022-12-26 MED ORDER — SPIRONOLACTONE 25 MG PO TABS
25.0000 mg | ORAL_TABLET | Freq: Every day | ORAL | 0 refills | Status: DC
  Filled 2022-12-26: qty 30, 30d supply, fill #0

## 2022-12-26 MED ORDER — SACUBITRIL-VALSARTAN 97-103 MG PO TABS
1.0000 | ORAL_TABLET | Freq: Two times a day (BID) | ORAL | 0 refills | Status: DC
  Filled 2022-12-26: qty 60, 30d supply, fill #0

## 2022-12-26 MED ORDER — ATORVASTATIN CALCIUM 40 MG PO TABS
40.0000 mg | ORAL_TABLET | Freq: Every day | ORAL | 0 refills | Status: DC
  Filled 2022-12-26: qty 30, 30d supply, fill #0

## 2022-12-26 MED ORDER — CARVEDILOL 6.25 MG PO TABS
6.2500 mg | ORAL_TABLET | Freq: Two times a day (BID) | ORAL | 0 refills | Status: DC
  Filled 2022-12-26: qty 60, 30d supply, fill #0

## 2022-12-26 NOTE — Discharge Summary (Addendum)
Physician Discharge Summary   Patient: John Velasquez MRN: [id number] DOB: 1965-03-13  Admit date:     12/25/2022  Discharge date: 12/26/22  Discharge Physician: Coralie Keens   PCP: Renford Dills, MD   Recommendations at discharge:    Patient has been placed back on guideline directed medical therapy for heart failure, with Entresto, spironolactone, and SGLT 2 inh. He has received assistance to get his medications.  Loop diuretic with furosemide 20 mg po daily.  He has ben advices to be adherent with medical therapy and follow up as outpatient.  Follow up renal function and electrolytes in 7 days as outpatient. Follow up with Dr Nehemiah Settle in 7 to 10 days.  Follow up with Cardiology as scheduled.   Discharge Diagnoses: Principal Problem:   Acute on chronic systolic (congestive) heart failure (HCC) Active Problems:   HTN (hypertension), benign   Thrombocytopenia (HCC)   Dyslipidemia  Resolved Problems:   * No resolved hospital problems. [facility] Course: John Velasquez was admitted to the hospital with the working diagnosis of heart failure decompensation.  58 yo male with the past medical history of hypertension and heart failure who presented with chest pain, dyspnea and orthopnea. Reported 3 days of worsening symptoms, not able to sleep due to orthopnea and PND. No lower extremity edema. He has been off medications for the last 10 months due to cost. On his initial physical examination his blood pressure was 161/121, HR 111. RR 18 and 02 saturation 95%, lungs with rales bilaterally, with no wheezing, heart with S1 and S2 present and regular, positive JVD, abdomen with no distention and no lower extremity edema.  Na 140, K 4,0 Cl 109, bicarbonate 23, glucose 107 bun 13 cr 1,0  BNP 969  High sensitive troponin 53 and 58  Wbc 9.0 hgb 14.8 plt 117    Chest radiograph with with cardiomegaly with bilateral hilar vascular congestion with bilateral central interstitial  infiltrates.   EKG 106 left axis deviation, normal intervals, sinus rhythm with bi atrial enlargement, with no significant ST segment or T wave changes.   Patient was placed on IV furosemide for diuresis and de congestion.  Resumed guideline directed medical therapy with good toleration.  Will need close follow up as outpatient.   Assessment and Plan: * Acute on chronic systolic (congestive) heart failure (HCC) Echocardiogram with reduced LV systolic function with EF 20 to 25%, global hypokinesis, mild LVH, RV systolic function preserved, LA and RA with moderate dilatation, no significant valvular disease.   Patient was placed on IV furosemide, negative fluid balance was achieved, -  3,740 ml, with significant improvement in his symptoms.   Continue heart failure management with SGLT 2 inh, entresto, and spironolactone.  Resumed carvedilol.   HTN (hypertension), benign On admission he had uncontrolled hypertension on admission due to volume overload.   Blood pressure has improved, plan to continue with carvedilol and entresto.  Follow up as outpatient.   Thrombocytopenia (HCC) Sable cell count follow up as outpatient.   Dyslipidemia Resume statin therapy.        Consultants: none  Procedures performed: none   Disposition: Home Diet recommendation:  Discharge Diet Orders (From admission, onward)     Start     Ordered   12/26/22 0000  Diet - low sodium heart healthy        12/26/22 1244           Cardiac diet DISCHARGE MEDICATION: Allergies as of 12/26/2022  Reactions   Penicillins Other (See Comments)   Unknown reaction        Medication List     TAKE these medications    atorvastatin 40 MG tablet Commonly known as: LIPITOR Take 1 tablet (40 mg total) by mouth daily.   carvedilol 6.25 MG tablet Commonly known as: COREG Take 1 tablet (6.25 mg total) by mouth 2 (two) times daily with a meal.   dapagliflozin propanediol 10 MG Tabs  tablet Commonly known as: FARXIGA Take 1 tablet (10 mg total) by mouth daily.   furosemide 20 MG tablet Commonly known as: LASIX Take 1 tablet (20 mg total) by mouth daily.   sacubitril-valsartan 97-103 MG Commonly known as: ENTRESTO Take 1 tablet by mouth 2 (two) times daily.   spironolactone 25 MG tablet Commonly known as: ALDACTONE Take 1 tablet (25 mg total) by mouth daily.        Follow-up Information     Georgann Housekeeper, MD Follow up.   Specialty: Internal Medicine Contact information: [address] [place] [address] [phone]                Discharge Exam: [name] Weights   12/25/22 0159 12/26/22 0340  Weight: 88.5 kg 85.5 kg   BP (!) 138/117 (BP Location: Right Arm)   Pulse 100   Temp 98.1 F (36.7 C) (Oral)   Resp 20   Ht 6' (1.829 m)   Wt 85.5 kg   SpO2 96%   BMI 25.57 kg/m   Patient is feeling better, no dyspnea or chest pain, no PND or orthopnea.   Neurology awake and alert ENT with mild pallor Cardiovascular with S1 and S2 present with no gallops, rubs or murmurs No JVD No lower extremity edema Respiratory with no rales or wheezing, no rhonchi Abdomen with no distention   Condition at discharge: stable  The results of significant diagnostics from this hospitalization (including imaging, microbiology, ancillary and laboratory) are listed below for reference.   Imaging Studies: ECHOCARDIOGRAM COMPLETE  Result Date: 12/25/2022    ECHOCARDIOGRAM REPORT   Patient Name:   John Velasquez Date of Exam: 12/25/2022 Medical Rec #:  [id number]       Height:       72.0 in Accession #:    [id number]      Weight:       195.0 lb Date of Birth:  26-Aug-1965       BSA:          2.108 m Patient Age:    57 years        BP:           114/83 mmHg Patient Gender: M               HR:           78 bpm. Exam Location:  Inpatient Procedure: 2D Echo, 3D Echo, Cardiac Doppler, Color Doppler and Strain Analysis Indications:    CHF acute systolic   History:        Patient has prior history of Echocardiogram examinations, most                 recent 01/16/2022. Risk Factors:Hypertension and Former Smoker.  Sonographer:    Karma Ganja Referring Phys: Lavone Neri OPYD  Sonographer Comments: Global longitudinal strain was attempted. IMPRESSIONS  1. Left ventricular ejection fraction, by estimation, is 20 to 25%. The left ventricle has severely decreased function. The left ventricle demonstrates global hypokinesis.  The left ventricular internal cavity size was mildly dilated. There is mild left ventricular hypertrophy. Left ventricular diastolic parameters are consistent with Grade III diastolic dysfunction (restrictive). The average left ventricular global longitudinal strain is -6.6 %. The global longitudinal strain is abnormal.  2. Right ventricular systolic function is normal. The right ventricular size is mildly enlarged. Tricuspid regurgitation signal is inadequate for assessing PA pressure.  3. Left atrial size was moderately dilated.  4. Right atrial size was moderately dilated.  5. The mitral valve is normal in structure. Trivial mitral valve regurgitation. No evidence of mitral stenosis.  6. The aortic valve is tricuspid. There is mild calcification of the aortic valve. Aortic valve regurgitation is not visualized. No aortic stenosis is present.  7. The inferior vena cava is normal in size with greater than 50% respiratory variability, suggesting right atrial pressure of 3 mmHg. FINDINGS  Left Ventricle: Left ventricular ejection fraction, by estimation, is 20 to 25%. The left ventricle has severely decreased function. The left ventricle demonstrates global hypokinesis. The average left ventricular global longitudinal strain is -6.6 %. The global longitudinal strain is abnormal. The left ventricular internal cavity size was mildly dilated. There is mild left ventricular hypertrophy. Left ventricular diastolic parameters are consistent with Grade III  diastolic dysfunction (restrictive). Right Ventricle: The right ventricular size is mildly enlarged. No increase in right ventricular wall thickness. Right ventricular systolic function is normal. Tricuspid regurgitation signal is inadequate for assessing PA pressure. Left Atrium: Left atrial size was moderately dilated. Right Atrium: Right atrial size was moderately dilated. Pericardium: There is no evidence of pericardial effusion. Mitral Valve: The mitral valve is normal in structure. Trivial mitral valve regurgitation. No evidence of mitral valve stenosis. Tricuspid Valve: The tricuspid valve is normal in structure. Tricuspid valve regurgitation is not demonstrated. Aortic Valve: The aortic valve is tricuspid. There is mild calcification of the aortic valve. Aortic valve regurgitation is not visualized. No aortic stenosis is present. Aortic valve mean gradient measures 2.0 mmHg. Aortic valve peak gradient measures 3.4 mmHg. Aortic valve area, by VTI measures 2.04 cm. Pulmonic Valve: The pulmonic valve was normal in structure. Pulmonic valve regurgitation is not visualized. Aorta: The aortic root is normal in size and structure. Venous: The inferior vena cava is normal in size with greater than 50% respiratory variability, suggesting right atrial pressure of 3 mmHg. IAS/Shunts: No atrial level shunt detected by color flow Doppler.  LEFT VENTRICLE PLAX 2D LVIDd:         5.90 cm      Diastology LVIDs:         5.20 cm      LV e' medial:    3.70 cm/s LV PW:         1.20 cm      LV E/e' medial:  20.4 LV IVS:        1.40 cm      LV e' lateral:   5.22 cm/s LVOT diam:     2.10 cm      LV E/e' lateral: 14.4 LV SV:         32 LV SV Index:   15           2D Longitudinal Strain LVOT Area:     3.46 cm     2D Strain GLS Avg:     -6.6 %  LV Volumes (MOD) LV vol d, MOD A2C: 126.0 ml 3D Volume EF: LV vol d, MOD A4C: 114.0 ml 3D EF:  31 % LV vol s, MOD A2C: 88.3 ml  LV EDV:       224 ml LV vol s, MOD A4C: 81.7 ml  LV ESV:        154 ml LV SV MOD A2C:     37.7 ml  LV SV:        69 ml LV SV MOD A4C:     114.0 ml LV SV MOD BP:      37.4 ml RIGHT VENTRICLE RV Basal diam:  4.50 cm RV S prime:     17.20 cm/s TAPSE (M-mode): 2.9 cm LEFT ATRIUM              Index        RIGHT ATRIUM           Index LA diam:        4.60 cm  2.18 cm/m   RA Area:     25.20 cm LA Vol (A2C):   189.0 ml 89.67 ml/m  RA Volume:   95.80 ml  45.45 ml/m LA Vol (A4C):   59.8 ml  28.37 ml/m LA Biplane Vol: 105.0 ml 49.82 ml/m  AORTIC VALVE AV Area (Vmax):    2.15 cm AV Area (Vmean):   2.02 cm AV Area (VTI):     2.04 cm AV Vmax:           92.30 cm/s AV Vmean:          61.100 cm/s AV VTI:            0.159 m AV Peak Grad:      3.4 mmHg AV Mean Grad:      2.0 mmHg LVOT Vmax:         57.40 cm/s LVOT Vmean:        35.600 cm/s LVOT VTI:          0.094 m LVOT/AV VTI ratio: 0.59  AORTA Ao Root diam: 3.30 cm Ao Asc diam:  3.60 cm MITRAL VALVE MV Area (PHT): 6.83 cm    SHUNTS MV Decel Time: 111 msec    Systemic VTI:  0.09 m MV E velocity: 75.40 cm/s  Systemic Diam: 2.10 cm MV A velocity: 42.40 cm/s MV E/A ratio:  1.78 Dalton McleanMD Electronically signed by Wilfred Lacy Signature Date/Time: 12/25/2022/1:52:16 PM    Final    DG Chest 2 View  Result Date: 12/25/2022 CLINICAL DATA:  Chest pain, CHF EXAM: CHEST - 2 VIEW COMPARISON:  None Available. FINDINGS: The lungs are symmetrically well expanded. Moderate bilateral perihilar interstitial pulmonary edema is present. Small right pleural effusion noted. Cardiac size is within normal limits. No pneumothorax. No acute bone abnormality. IMPRESSION: 1. Moderate interstitial pulmonary edema, likely cardiogenic in nature. Small right pleural effusion. Electronically Signed   By: Helyn Numbers M.D.   On: 12/25/2022 02:28    Microbiology: Results for orders placed or performed during the hospital encounter of 04/12/17  Blood culture (routine x 2)     Status: None   Collection Time: 04/13/17 12:48 AM   Specimen: BLOOD  LEFT HAND  Result Value Ref Range Status   Specimen Description BLOOD LEFT HAND  Final   Special Requests   Final    BOTTLES DRAWN AEROBIC AND ANAEROBIC Blood Culture adequate volume   Culture   Final    NO GROWTH 5 DAYS Performed at [facility], [address] [place], [place] [address]    Report Status 04/18/2017 FINAL  Final  Blood culture (  routine x 2)     Status: None   Collection Time: 04/13/17  1:00 AM   Specimen: BLOOD RIGHT HAND  Result Value Ref Range Status   Specimen Description BLOOD RIGHT HAND  Final   Special Requests   Final    BOTTLES DRAWN AEROBIC AND ANAEROBIC Blood Culture adequate volume   Culture   Final    NO GROWTH 5 DAYS Performed at [facility], [address] [place], [place] [address]    Report Status 04/18/2017 FINAL  Final    Labs: CBC: Recent Labs  Lab 12/25/22 0208  WBC 9.0  HGB 14.8  HCT 45.6  MCV 82.9  PLT 117*   Basic Metabolic Panel: Recent Labs  Lab 12/25/22 0208 12/26/22 0311  NA 140 138  K 4.0 3.4*  CL 109 105  CO2 23 24  GLUCOSE 107* 99  BUN 13 15  CREATININE 1.00 1.12  CALCIUM 8.8* 8.8*  MG  --  2.1   Liver Function Tests: No results for input(s): "AST", "ALT", "ALKPHOS", "BILITOT", "PROT", "ALBUMIN" in the last 168 hours. CBG: No results for input(s): "GLUCAP" in the last 168 hours.  Discharge time spent: greater than 30 minutes.  Signed: Coralie Keens, MD [facility] 12/26/2022

## 2022-12-26 NOTE — Progress Notes (Addendum)
D/c instructions indicate pt needs to follow up with cardiology as scheduled. Pt doesn't remember his cardiologist's name---pt states it is here in the CHF clinic, notes in chart indicate Dr Gasper Lloyd is his cardiologist and they are part of the CHF clinic. Appointment maker not available, message left with pt name, DOB, phone number and need for them to call the pt to inform him of his appointment time or make him an appointment  for follow up per MD request.   Discharge instructions reviewed with pt.   Copy of instructions given to pt. [facility] has filled pt scripts and will be picked up on the way out for discharge. Pt drove his car to the hospital and able to drive himself home. Work note printed and given to pt.  Pt will be d/c'd via wheelchair with belongings.         To be escorted by staff.   Madalene Mickler,RN SWOT

## 2022-12-26 NOTE — Plan of Care (Signed)
  Problem: Clinical Measurements: Goal: Ability to maintain clinical measurements within normal limits will improve Outcome: Progressing   

## 2022-12-26 NOTE — TOC Initial Note (Signed)
Transition of Care [facility]) - Initial/Assessment Note    Patient Details  Name: John Velasquez MRN: [id number] Date of Birth: [birth year]  Transition of Care [facility]) CM/SW Contact:    Leone Haven, RN Phone Number: 12/26/2022, 11:27 AM  Clinical Narrative:                 From home with Mom, has PCP and insurance on file, states has no HH services in place at this time or DME at home.  States he will drive himself  home at [employer] and Mom and Aunts is support system, states [facility] [address].  Pta self ambulatory.  NCM gave him 10.00 copay coupon for entresto and 0 copay for farxiga.    Expected Discharge Plan: Home/Self Care Barriers to Discharge: No Barriers Identified   Patient Goals and CMS Choice Patient states their goals for this hospitalization and ongoing recovery are:: return home   Choice offered to / list presented to : NA      Expected Discharge Plan and Services In-house Referral: NA   Post Acute Care Choice: NA Living arrangements for the past 2 months: Single Family Home                 DME Arranged: N/A DME Agency: NA       HH Arranged: NA          Prior Living Arrangements/Services Living arrangements for the past 2 months: Single Family Home Lives with:: Parents (mom) Patient language and need for interpreter reviewed:: Yes Do you feel safe going back to the place where you live?: Yes      Need for Family Participation in Patient Care: No (Comment) Care giver support system in place?: Yes (comment)   Criminal Activity/Legal Involvement Pertinent to Current Situation/Hospitalization: No - Comment as needed  Activities of Daily Living   ADL Screening (condition at time of admission) Independently performs ADLs?: Yes (appropriate for developmental age) Is the patient deaf or have difficulty hearing?: No Does the patient have difficulty seeing, even when wearing glasses/contacts?: Yes Does the patient have difficulty  concentrating, remembering, or making decisions?: No  Permission Sought/Granted Permission sought to share information with : Case Manager Permission granted to share information with : Yes, Verbal Permission Granted              Emotional Assessment Appearance:: Appears stated age Attitude/Demeanor/Rapport: Engaged Affect (typically observed): Appropriate Orientation: : Oriented to Self, Oriented to Place, Oriented to  Time, Oriented to Situation   Psych Involvement: No (comment)  Admission diagnosis:  Hypertensive emergency [I16.1] Acute on chronic systolic (congestive) heart failure (HCC) [I50.23] Acute on chronic congestive heart failure, unspecified heart failure type [facility]) [I50.9] Patient Active Problem List   Diagnosis Date Noted   Acute on chronic systolic (congestive) heart failure (HCC) 12/25/2022   Hypertensive urgency 12/25/2022   Elevated troponin 12/25/2022   Thrombocytopenia (HCC) 12/25/2022   Dyslipidemia 12/25/2022   Acute HFrEF (heart failure with reduced ejection fraction) (HCC) 11/07/2021   Acute on chronic diastolic CHF (congestive heart failure) (HCC) 11/06/2021   CHF (congestive heart failure) (HCC) 11/06/2021   Cellulitis 04/13/2017   Tinea 04/13/2017   Lactic acidosis 04/13/2017   Hypokalemia 04/13/2017   Tobacco abuse 04/13/2017   Hyperglycemia 04/13/2017   Cellulitis of foot    HTN (hypertension), benign 03/10/2016   Leukonychia 03/10/2016   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   PCP:  Renford Dills, MD Pharmacy:   [id number] - [place]  [place] - [address] [place] ([place]) [place] [address] Phone: [phone] Fax: [phone]  [facility] [address] [place] [address] Phone: [phone] Fax: [phone]  [id number] [place], [place] - [address] AT [facility] [address] [place] [address] Phone:  [phone] Fax: [phone]     Social Determinants of Health (SDOH) Social History: SDOH Screenings   Food Insecurity: Patient Declined (12/25/2022)  Housing: High Risk (12/25/2022)  Transportation Needs: Patient Declined (12/25/2022)  Utilities: Patient Declined (12/25/2022)  Alcohol Screen: Low Risk  (11/07/2021)  Financial Resource Strain: Low Risk  (11/07/2021)  Stress: No Stress Concern Present (11/07/2021)  Tobacco Use: Medium Risk (12/25/2022)   SDOH Interventions:     Readmission Risk Interventions     No data to display

## 2022-12-26 NOTE — TOC Transition Note (Signed)
Transition of Care [facility]) - CM/SW Discharge Note   Patient Details  Name: John Velasquez MRN: [id number] Date of Birth: 1965-07-09  Transition of Care [facility]) CM/SW Contact:  Leone Haven, RN Phone Number: 12/26/2022, 11:30 AM   Clinical Narrative:    For possible dc today,  NCM gave him copay coupons for farxiga, entresto. He states he will drive himself home at dc.    Final next level of care: Home/Self Care Barriers to Discharge: No Barriers Identified   Patient Goals and CMS Choice   Choice offered to / list presented to : NA  Discharge Placement                         Discharge Plan and Services Additional resources added to the After Visit Summary for   In-house Referral: NA   Post Acute Care Choice: NA          DME Arranged: N/A DME Agency: NA       HH Arranged: NA          Social Determinants of Health (SDOH) Interventions SDOH Screenings   Food Insecurity: Patient Declined (12/25/2022)  Housing: High Risk (12/25/2022)  Transportation Needs: Patient Declined (12/25/2022)  Utilities: Patient Declined (12/25/2022)  Alcohol Screen: Low Risk  (11/07/2021)  Financial Resource Strain: Low Risk  (11/07/2021)  Stress: No Stress Concern Present (11/07/2021)  Tobacco Use: Medium Risk (12/25/2022)     Readmission Risk Interventions     No data to display

## 2023-01-26 ENCOUNTER — Other Ambulatory Visit (HOSPITAL_COMMUNITY): Payer: Self-pay | Admitting: Cardiology

## 2023-01-26 ENCOUNTER — Other Ambulatory Visit: Payer: Self-pay

## 2023-01-28 ENCOUNTER — Other Ambulatory Visit (HOSPITAL_COMMUNITY): Payer: Self-pay

## 2023-01-28 MED ORDER — FUROSEMIDE 20 MG PO TABS
20.0000 mg | ORAL_TABLET | Freq: Every day | ORAL | 0 refills | Status: DC
  Filled 2023-01-28: qty 30, 30d supply, fill #0

## 2023-01-28 MED ORDER — ATORVASTATIN CALCIUM 40 MG PO TABS
40.0000 mg | ORAL_TABLET | Freq: Every day | ORAL | 0 refills | Status: DC
  Filled 2023-01-28: qty 30, 30d supply, fill #0

## 2023-01-28 MED ORDER — CARVEDILOL 6.25 MG PO TABS
6.2500 mg | ORAL_TABLET | Freq: Two times a day (BID) | ORAL | 0 refills | Status: DC
  Filled 2023-01-28: qty 60, 30d supply, fill #0

## 2023-01-28 MED ORDER — SPIRONOLACTONE 25 MG PO TABS
25.0000 mg | ORAL_TABLET | Freq: Every day | ORAL | 0 refills | Status: DC
  Filled 2023-01-28: qty 30, 30d supply, fill #0

## 2023-01-28 MED ORDER — ENTRESTO 97-103 MG PO TABS
1.0000 | ORAL_TABLET | Freq: Two times a day (BID) | ORAL | 0 refills | Status: DC
  Filled 2023-01-28: qty 60, 30d supply, fill #0

## 2023-01-28 MED ORDER — DAPAGLIFLOZIN PROPANEDIOL 10 MG PO TABS
10.0000 mg | ORAL_TABLET | Freq: Every day | ORAL | 0 refills | Status: DC
  Filled 2023-01-28: qty 30, 30d supply, fill #0

## 2023-01-30 ENCOUNTER — Ambulatory Visit (HOSPITAL_COMMUNITY)
Admit: 2023-01-30 | Discharge: 2023-01-30 | Disposition: A | Discharge status: Home / Self Care | Payer: BC Managed Care – PPO | Attending: Cardiology | Admitting: Cardiology

## 2023-01-30 ENCOUNTER — Encounter (HOSPITAL_COMMUNITY): Payer: Self-pay | Admitting: Cardiology

## 2023-01-30 ENCOUNTER — Other Ambulatory Visit: Payer: Self-pay

## 2023-01-30 ENCOUNTER — Other Ambulatory Visit (HOSPITAL_COMMUNITY): Payer: Self-pay

## 2023-01-30 VITALS — BP 148/106 | HR 78 | Ht 72.0 in | Wt 191.0 lb

## 2023-01-30 DIAGNOSIS — I5022 Chronic systolic (congestive) heart failure: Secondary | ICD-10-CM | POA: Diagnosis not present

## 2023-01-30 DIAGNOSIS — I428 Other cardiomyopathies: Secondary | ICD-10-CM | POA: Diagnosis not present

## 2023-01-30 DIAGNOSIS — I10 Essential (primary) hypertension: Secondary | ICD-10-CM | POA: Diagnosis not present

## 2023-01-30 DIAGNOSIS — F17210 Nicotine dependence, cigarettes, uncomplicated: Secondary | ICD-10-CM | POA: Insufficient documentation

## 2023-01-30 DIAGNOSIS — I110 Hypertensive heart disease with heart failure: Secondary | ICD-10-CM | POA: Diagnosis not present

## 2023-01-30 DIAGNOSIS — Z720 Tobacco use: Secondary | ICD-10-CM

## 2023-01-30 DIAGNOSIS — Z79899 Other long term (current) drug therapy: Secondary | ICD-10-CM | POA: Insufficient documentation

## 2023-01-30 DIAGNOSIS — Z7984 Long term (current) use of oral hypoglycemic drugs: Secondary | ICD-10-CM | POA: Diagnosis not present

## 2023-01-30 DIAGNOSIS — I5040 Unspecified combined systolic (congestive) and diastolic (congestive) heart failure: Secondary | ICD-10-CM | POA: Diagnosis not present

## 2023-01-30 LAB — BRAIN NATRIURETIC PEPTIDE: B Natriuretic Peptide: 233.7 pg/mL — ABNORMAL HIGH (ref 0.0–100.0)

## 2023-01-30 LAB — BASIC METABOLIC PANEL
Anion gap: 8 (ref 5–15)
BUN: 13 mg/dL (ref 6–20)
CO2: 25 mmol/L (ref 22–32)
Calcium: 9.3 mg/dL (ref 8.9–10.3)
Chloride: 106 mmol/L (ref 98–111)
Creatinine, Ser: 0.84 mg/dL (ref 0.61–1.24)
GFR, Estimated: >60 mL/min (ref >60)
Glucose, Bld: 100 mg/dL — ABNORMAL HIGH (ref 70–99)
Potassium: 4.7 mmol/L (ref 3.5–5.1)
Sodium: 139 mmol/L (ref 135–145)

## 2023-01-30 MED ORDER — DAPAGLIFLOZIN PROPANEDIOL 10 MG PO TABS
10.0000 mg | ORAL_TABLET | Freq: Every day | ORAL | 3 refills | Status: DC
  Filled 2023-01-30 (×2): qty 30, 30d supply, fill #0

## 2023-01-30 MED ORDER — FUROSEMIDE 20 MG PO TABS
20.0000 mg | ORAL_TABLET | Freq: Every day | ORAL | 3 refills | Status: DC
  Filled 2023-01-30 (×2): qty 30, 30d supply, fill #0

## 2023-01-30 MED ORDER — NICOTINE 21 MG/24HR TD PT24
21.0000 mg | MEDICATED_PATCH | Freq: Every day | TRANSDERMAL | 0 refills | Status: DC
  Filled 2023-01-30: qty 28, 28d supply, fill #0

## 2023-01-30 MED ORDER — CARVEDILOL 12.5 MG PO TABS: 12.5000 mg | ORAL_TABLET | Freq: Two times a day (BID) | ORAL | 3 refills | Status: DC

## 2023-01-30 MED ORDER — NICOTINE 7 MG/24HR TD PT24: 7.0000 mg | MEDICATED_PATCH | Freq: Every day | TRANSDERMAL | 0 refills | Status: DC

## 2023-01-30 MED ORDER — NICOTINE 14 MG/24HR TD PT24: 14.0000 mg | MEDICATED_PATCH | Freq: Every day | TRANSDERMAL | 0 refills | Status: DC

## 2023-01-30 MED ORDER — ATORVASTATIN CALCIUM 40 MG PO TABS
40.0000 mg | ORAL_TABLET | Freq: Every day | ORAL | 3 refills | Status: DC
  Filled 2023-01-30 (×2): qty 30, 30d supply, fill #0

## 2023-01-30 MED ORDER — CARVEDILOL 12.5 MG PO TABS
12.5000 mg | ORAL_TABLET | Freq: Two times a day (BID) | ORAL | 3 refills | Status: DC
  Filled 2023-01-30 (×2): qty 60, 30d supply, fill #0

## 2023-01-30 MED ORDER — ATORVASTATIN CALCIUM 40 MG PO TABS: 40.0000 mg | ORAL_TABLET | Freq: Every day | ORAL | 3 refills | Status: DC

## 2023-01-30 MED ORDER — DAPAGLIFLOZIN PROPANEDIOL 10 MG PO TABS: 10.0000 mg | ORAL_TABLET | Freq: Every day | ORAL | 3 refills | Status: DC

## 2023-01-30 MED ORDER — SPIRONOLACTONE 25 MG PO TABS: 25.0000 mg | ORAL_TABLET | Freq: Every day | ORAL | 3 refills | Status: DC

## 2023-01-30 MED ORDER — ENTRESTO 97-103 MG PO TABS: 1.0000 | ORAL_TABLET | Freq: Two times a day (BID) | ORAL | 3 refills | Status: DC

## 2023-01-30 MED ORDER — ENTRESTO 97-103 MG PO TABS
1.0000 | ORAL_TABLET | Freq: Two times a day (BID) | ORAL | 3 refills | Status: DC
  Filled 2023-01-30 (×2): qty 60, 30d supply, fill #0

## 2023-01-30 MED ORDER — FUROSEMIDE 20 MG PO TABS: 20.0000 mg | ORAL_TABLET | Freq: Every day | ORAL | 3 refills | Status: DC

## 2023-01-30 MED ORDER — SPIRONOLACTONE 25 MG PO TABS
25.0000 mg | ORAL_TABLET | Freq: Every day | ORAL | 3 refills | Status: DC
  Filled 2023-01-30 (×2): qty 30, 30d supply, fill #0

## 2023-01-30 NOTE — Progress Notes (Signed)
 ADVANCED HEART FAILURE CLINIC NOTE  Referring Physician: Rexanne Ingle, MD  Primary Care: John Ingle, MD Primary Cardiologist:  HPI: John Velasquez is a 57 y.o. male with uncontrolled hypertension, tobacco use and recently diagnosed systolic heart failure presenting today to establish care.  I saw John Velasquez at [facility] last week after he presented with a 2-week history of substernal chest pressure, shortness of breath and paroxysmal nocturnal dyspnea.  During that admission he was found to have an LVEF of 20 to 25%, cardiac index of 2.1 by right heart cath and no angiographically significant coronary artery disease.  John Velasquez was started on appropriate GDMT, diuresed and discharged home.  In addition he had a cardiac MRI that was largely unremarkable and consistent with a forementioned echo.  Interval history - Admitted to [facility] in November 2024 with HFrEF exacerbation; at that time he reported being off medications for 10 months; he was unfortunately lost to follow up last year in late 2023.  - He reports that he is once more almost out of medications today.  - Doing fairly well from a functional standpoint. He reports resolution of shortness of breath and orthopnea after recent hospitalization and IV diuresis.   Activity level/exercise tolerance: NYHA II Orthopnea:  Sleeps on 1-2 pillows Paroxysmal noctural dyspnea: No Chest pain/pressure: No Orthostatic lightheadedness: No Palpitations: No Lower extremity edema: No Presyncope/syncope: No Cough: No  Past Medical History:  Diagnosis Date   Cellulitis 04/13/2017   BILATERAL FEET   Dyslipidemia 12/25/2022   Hypertension     Current Outpatient Medications  Medication Sig Dispense Refill   nicotine  (NICODERM CQ  - DOSED IN MG/24 HOURS) 21 mg/24hr patch Use as directed. 1 patch (21 mg) onto the sin every 24 hours. 28 patch 0   atorvastatin  (LIPITOR) 40 MG tablet Take 1 tablet (40 mg total) by mouth daily.  30 tablet 3   carvedilol  (COREG ) 12.5 MG tablet Take 1 tablet (12.5 mg total) by mouth 2 (two) times daily with a meal. 60 tablet 3   dapagliflozin  propanediol (FARXIGA ) 10 MG TABS tablet Take 1 tablet (10 mg total) by mouth daily. 30 tablet 3   furosemide  (LASIX ) 20 MG tablet Take 1 tablet (20 mg total) by mouth daily. 30 tablet 3   nicotine  (NICODERM CQ  - DOSED IN MG/24 HOURS) 14 mg/24hr patch Place 1 patch (14 mg total) onto the skin daily. 14 patch 0   nicotine  (NICODERM CQ  - DOSED IN MG/24 HR) 7 mg/24hr patch Place 1 patch (7 mg total) onto the skin daily. 14 patch 0   sacubitril -valsartan  (ENTRESTO ) 97-103 MG Take 1 tablet by mouth 2 (two) times daily. 60 tablet 3   spironolactone  (ALDACTONE ) 25 MG tablet Take 1 tablet (25 mg total) by mouth daily. 30 tablet 3   No current facility-administered medications for this encounter.    Allergies  Allergen Reactions   Penicillins Other (See Comments)    Unknown reaction      Social History   Socioeconomic History   Marital status: Divorced    Spouse name: Not on file   Number of children: 1   Years of education: Not on file   Highest education level: High school graduate  Occupational History   Occupation: Manufacuturing    Comment: neurosurgeon  Tobacco Use   Smoking status: Former    Current packs/day: 0.00    Average packs/day: 1 pack/day for 39.0 years (39.0 ttl pk-yrs)    Types: Cigarettes  Start date: 01/30/1982    Quit date: 01/30/2021    Years since quitting: 2.0   Smokeless tobacco: Never  Vaping Use   Vaping status: Never Used  Substance and Sexual Activity   Alcohol use: No   Drug use: No   Sexual activity: Yes    Partners: Female    Birth control/protection: Condom  Other Topics Concern   Not on file  Social History Narrative   Not on file   Social Drivers of Health   Financial Resource Strain: Low Risk  (11/07/2021)   Overall Financial Resource Strain (CARDIA)    Difficulty of Paying  Living Expenses: Not very hard  Food Insecurity: Patient Declined (12/25/2022)   Hunger Vital Sign    Worried About Running Out of Food in the Last Year: Patient declined    Ran Out of Food in the Last Year: Patient declined  Transportation Needs: Patient Declined (12/25/2022)   PRAPARE - Administrator, Civil Service (Medical): Patient declined    Lack of Transportation (Non-Medical): Patient declined  Physical Activity: Not on file  Stress: No Stress Concern Present (11/07/2021)   [employer] of Occupational Health - Occupational Stress Questionnaire    Feeling of Stress : Only a little  Social Connections: Not on file  Intimate Partner Violence: Patient Declined (12/25/2022)   Humiliation, Afraid, Rape, and Kick questionnaire    Fear of Current or Ex-Partner: Patient declined    Emotionally Abused: Patient declined    Physically Abused: Patient declined    Sexually Abused: Patient declined      Family History  Problem Relation Age of Onset   Cancer Maternal Grandmother        unknown   Cancer Paternal Grandmother        unknown   Heart disease Mother    Hypertension Mother    Cancer Father        throat   Alcohol abuse Father     PHYSICAL EXAM: Vitals:   01/30/23 0930  BP: (!) 148/106  Pulse: 78  SpO2: 99%   GENERAL: Well nourished, well developed, and in no apparent distress at rest.  HEENT: There is no scleral icterus.  The mucous membranes are pink and moist.   CHEST: There are no chest wall deformities. There is no chest wall tenderness. Respirations are unlabored.  Lungs- CTA B/L CARDIAC:  JVP: 7 cm          Normal rate with regular rhythm. No murmur.  Pulses are 2+ and symmetrical in upper and lower extremities. No edema.  ABDOMEN: Soft, non-tender, non-distended. There are normal bowel sounds.  EXTREMITIES: Warm and well perfused.  NEUROLOGIC: Patient is oriented x3 with no obvious focal neurologic deficits.  PSYCH: Patients affect is  appropriate SKIN: Warm and dry; no lesions or wounds.    DATA REVIEW  ECG: 11/22/21: NSR w/ PAC  ECHO: 11/07/21: LVEF 20-25%, Grade II DD, normal RV function; severely dilated LA.  12/25/22: LVEF 20-25%,   CATH: 11/08/21: HEMODYNAMICS: RA:                  2 mmHg (mean) RV:                  18 mmHg PA:                  14/5 mmHg (8 mean) PCWP:            7 mmHg (mean) LVEDP: 3-5  Estimated Fick CO/CI   4.25 L/min, 2.07 L/min/m2                                          TPG                 1  mmHg                                              PVR                 <1 [place] Units  PAPi                4.5       IMPRESSION: 1.           Low pre and post capillary filling pressures, PCWP or PA diastolic likely underestimated.  2.         Moderately reduced cardiac index 3.         Normal PVR 4.         Right dominant coronary circulation with no angiography significant CAD.   CMR: 11/08/21: 1.  Mild LV dilation with EF 16%, global hypokinesis.  2.  Normal RV size with EF 27%.  3. Non-coronary LGE pattern with mid-wall LGE in the basal to mid septum and mid-wall and subepicardial LGE in the basal to mid lateral wall. This could suggest prior myocarditis. T2 signal not elevated in the septum or lateral wall, making acute myocarditis less likely.  4. ECV percentage mildly elevated at 35, this range is not suggestive of cardiac amyloidosis (suggestive when > 40).  ASSESSMENT & PLAN:  NYHA II, Stage C, Nonischemic cardiomyopathy / HFrEF Etiology of YQ:Opxzob 2/2 hypertensive cardiomyopathy; LHC unremarkable; mildly reduced CI by RHC. No significant family history of heart disease.  NYHA class / AHA Stage:II Volume status & Diuretics: Euvolemic, taking lasix  20mg  PO daily.  Vasodilators:Continue Entresto  97/103 BID; He did not take his meds this morning.  Beta-Blocker: Increase Coreg  to 12.5mg  BID MRA: Continue spironolactone  25mg   daily Cardiometabolic: Continue Farxiga  Devices therapies & Valvulopathies: TTE w/ LVEF of 25% ; he has been off of GDMT. Will repeat in 88m.  Advanced therapies: Not indicated  2.  Hypertension -Hypertensive today; did not take meds this morning -repeat BMP/BNP today.   3.  Tobacco use -He is smoking again; roughly 1PPD.  -Discussed extensively; will prescribe nicotine  patches today.   I spent 38-42 minutes caring for this patient today including face to face time, ordering and reviewing labs, reviewing records from hospitalization in November 2024, extensive counseling on medication compliance, seeing the patient, documenting in the record, and arranging follow ups.   Cosandra Plouffe Advanced Heart Failure Mechanical Circulatory Support

## 2023-01-30 NOTE — Patient Instructions (Addendum)
 Medication Changes:  INCREASE CARVEDILOL  12.5MG  TWICE DAILY   MEDICATIONS REFILLED- PLEASE GO TO [facility] TO PICK THESE UP ADDRESS [address] [place]  NICOTINE  PATCHES- USE AS DIRECTED ON KIT FROM PHARMACY- MAY HAVE TO GET THIS OVER THE COUNTER   Lab Work:  Labs done today, your results will be available in MyChart, we will contact you for abnormal readings.  Follow-Up in: 1 MONTH AS SCHEDULED   At the [facility], you and your health needs are our priority. We have a designated team specialized in the treatment of Heart Failure. This Care Team includes your primary Heart Failure Specialized Cardiologist (physician), Advanced Practice Providers (APPs- Physician Assistants and Nurse Practitioners), and Pharmacist who all work together to provide you with the care you need, when you need it.   You may see any of the following providers on your designated Care Team at your next follow up:  Dr. Toribio Fuel Dr. Ezra Shuck Dr. Ria Commander Dr. Odis Brownie Greig Mosses, NP Caffie Shed, [place] [facility] [place], [place] Beckey Coe, NP Jordan Lee, NP Tinnie Redman, PharmD   Please be sure to bring in all your medications bottles to every appointment.   Need to Contact Us :  If you have any questions or concerns before your next appointment please send us  a message through [place] or call our office at [phone].    TO LEAVE A MESSAGE FOR THE NURSE SELECT OPTION 2, PLEASE LEAVE A MESSAGE INCLUDING: YOUR NAME DATE OF BIRTH CALL BACK NUMBER REASON FOR CALL**this is important as we prioritize the call backs  YOU WILL RECEIVE A CALL BACK THE SAME DAY AS LONG AS YOU CALL BEFORE 4:00 PM

## 2023-02-22 NOTE — Progress Notes (Signed)
ADVANCED HEART FAILURE CLINIC NOTE  Referring Physician: Renford Dills, MD  Primary Care: Renford Dills, MD Primary Cardiologist:  HPI: John Velasquez is a 58 y.o. male with uncontrolled hypertension, tobacco use and recently diagnosed systolic heart failure presenting today to establish care.  I saw John Velasquez at [facility] last week after he presented with a 2-week history of substernal chest pressure, shortness of breath and paroxysmal nocturnal dyspnea.  During that admission he was found to have an LVEF of 20 to 25%, cardiac index of 2.1 by right heart cath and no angiographically significant coronary artery disease.  John Velasquez was started on appropriate GDMT, diuresed and discharged home.  In addition he had a cardiac MRI that was largely unremarkable and consistent with a forementioned echo.  Interval history - He has been doing much better with medication compliance after our extensive discussion prevoiusly.  -Doing very well from a functional standpoint; he has had two episodes of significant lightheadedness. The first was shortly after restarting medications and the last one was roughly 1-2 weeks ago. No further episodes since that time.   Activity level/exercise tolerance: NYHA II Orthopnea:  Sleeps on 1-2 pillows Paroxysmal noctural dyspnea: No Chest pain/pressure: No Orthostatic lightheadedness: No Palpitations: No Lower extremity edema: No Presyncope/syncope: No Cough: No  Past Medical History:  Diagnosis Date   Cellulitis 04/13/2017   BILATERAL FEET   Dyslipidemia 12/25/2022   Hypertension     Current Outpatient Medications  Medication Sig Dispense Refill   atorvastatin (LIPITOR) 40 MG tablet Take 1 tablet (40 mg total) by mouth daily. 30 tablet 3   carvedilol (COREG) 12.5 MG tablet Take 1 tablet (12.5 mg total) by mouth 2 (two) times daily with a meal. 60 tablet 3   dapagliflozin propanediol (FARXIGA) 10 MG TABS tablet Take 1 tablet (10 mg total)  by mouth daily. 30 tablet 3   furosemide (LASIX) 20 MG tablet Take 1 tablet (20 mg total) by mouth daily. 30 tablet 3   nicotine (NICODERM CQ - DOSED IN MG/24 HOURS) 14 mg/24hr patch Place 1 patch (14 mg total) onto the skin daily. 14 patch 0   nicotine (NICODERM CQ - DOSED IN MG/24 HOURS) 21 mg/24hr patch Use as directed. 1 patch (21 mg) onto the sin every 24 hours. 28 patch 0   nicotine (NICODERM CQ - DOSED IN MG/24 HR) 7 mg/24hr patch Place 1 patch (7 mg total) onto the skin daily. 14 patch 0   sacubitril-valsartan (ENTRESTO) 97-103 MG Take 1 tablet by mouth 2 (two) times daily. 60 tablet 3   spironolactone (ALDACTONE) 25 MG tablet Take 1 tablet (25 mg total) by mouth daily. 30 tablet 3   No current facility-administered medications for this encounter.    Allergies  Allergen Reactions   Penicillins Other (See Comments)    Unknown reaction      Social History   Socioeconomic History   Marital status: Divorced    Spouse name: Not on file   Number of children: 1   Years of education: Not on file   Highest education level: High school graduate  Occupational History   Occupation: Manufacuturing    Comment: Neurosurgeon  Tobacco Use   Smoking status: Former    Current packs/day: 0.00    Average packs/day: 1 pack/day for 39.0 years (39.0 ttl pk-yrs)    Types: Cigarettes    Start date: 01/30/1982    Quit date: 01/30/2021    Years since quitting: 2.0  Smokeless tobacco: Never  Vaping Use   Vaping status: Never Used  Substance and Sexual Activity   Alcohol use: No   Drug use: No   Sexual activity: Yes    Partners: Female    Birth control/protection: Condom  Other Topics Concern   Not on file  Social History Narrative   Not on file   Social Drivers of Health   Financial Resource Strain: Low Risk  (11/07/2021)   Overall Financial Resource Strain (CARDIA)    Difficulty of Paying Living Expenses: Not very hard  Food Insecurity: Patient Declined (12/25/2022)    Hunger Vital Sign    Worried About Running Out of Food in the Last Year: Patient declined    Ran Out of Food in the Last Year: Patient declined  Transportation Needs: Patient Declined (12/25/2022)   PRAPARE - Administrator, Civil Service (Medical): Patient declined    Lack of Transportation (Non-Medical): Patient declined  Physical Activity: Not on file  Stress: No Stress Concern Present (11/07/2021)   [employer] of Occupational Health - Occupational Stress Questionnaire    Feeling of Stress : Only a little  Social Connections: Not on file  Intimate Partner Violence: Patient Declined (12/25/2022)   Humiliation, Afraid, Rape, and Kick questionnaire    Fear of Current or Ex-Partner: Patient declined    Emotionally Abused: Patient declined    Physically Abused: Patient declined    Sexually Abused: Patient declined      Family History  Problem Relation Age of Onset   Cancer Maternal Grandmother        unknown   Cancer Paternal Grandmother        unknown   Heart disease Mother    Hypertension Mother    Cancer Father        throat   Alcohol abuse Father     PHYSICAL EXAM: Vitals:   02/23/23 0922  BP: 98/76  Pulse: 89  SpO2: 99%   GENERAL: Well nourished, well developed, and in no apparent distress at rest.  HEENT: There is no scleral icterus.  The mucous membranes are pink and moist.   CHEST: There are no chest wall deformities. There is no chest wall tenderness. Respirations are unlabored.  Lungs- CTA B/L CARDIAC:  JVP: 6 cm          Normal rate with regular rhythm. No murmur.  Pulses are 2+ and symmetrical in upper and lower extremities. No edema.  ABDOMEN: Soft, non-tender, non-distended. There are normal bowel sounds.  EXTREMITIES: Warm and well perfused.  NEUROLOGIC: Patient is oriented x3 with no obvious focal neurologic deficits.  PSYCH: Patients affect is appropriate SKIN: Warm and dry; no lesions or wounds.     DATA REVIEW  ECG: 11/22/21:  NSR w/ PAC  ECHO: 11/07/21: LVEF 20-25%, Grade II DD, normal RV function; severely dilated LA.  12/25/22: LVEF 20-25%,   CATH: 11/08/21: HEMODYNAMICS: RA:                  2 mmHg (mean) RV:                  18 mmHg PA:                  14/5 mmHg (8 mean) PCWP:            7 mmHg (mean) LVEDP: 3-5  Estimated Fick CO/CI   4.25 L/min, 2.07 L/min/m2                                          TPG                 1  mmHg                                              PVR                 <1 Wood Units  PAPi                4.5       IMPRESSION: 1.           Low pre and post capillary filling pressures, PCWP or PA diastolic likely underestimated.  2.         Moderately reduced cardiac index 3.         Normal PVR 4.         Right dominant coronary circulation with no angiography significant CAD.   CMR: 11/08/21: 1.  Mild LV dilation with EF 16%, global hypokinesis.  2.  Normal RV size with EF 27%.  3. Non-coronary LGE pattern with mid-wall LGE in the basal to mid septum and mid-wall and subepicardial LGE in the basal to mid lateral wall. This could suggest prior myocarditis. T2 signal not elevated in the septum or lateral wall, making acute myocarditis less likely.  4. ECV percentage mildly elevated at 35, this range is not suggestive of cardiac amyloidosis (suggestive when > 40).  ASSESSMENT & PLAN:  NYHA II, Stage C, Nonischemic cardiomyopathy / HFrEF Etiology of QI:ONGEXB 2/2 hypertensive cardiomyopathy; LHC unremarkable; mildly reduced CI by RHC. No significant family history of heart disease.  NYHA class / AHA Stage:II Volume status & Diuretics: Euvolemic, taking lasix 20mg  PO daily.  Vasodilators:Continue Entresto 97/103 BID; BP at goal today.  Beta-Blocker: Continue Coreg to 12.5mg  BID MRA: Continue spironolactone 25mg  daily Cardiometabolic: Continue Farxiga Devices therapies & Valvulopathies: TTE w/ LVEF of 25% ; repeat TTE at follow up.   Advanced therapies: Not indicated  2.  Hypertension -Blood pressure at goal today; possibly slightly too hypotensive; however, we will continue meds at current doses.  - Repeat BMP/BNP today.   3.  Tobacco use -He has continued to smoke; reduced to 5-6 cigarettes daily.  -He reports that the patches are not working now but will continue to try to cut down.  -Does not drink alcohol.   I spent 38-42 minutes caring for this patient today including face to face time, ordering and reviewing labs, reviewing records from hospitalization in November 2024, extensive counseling on medication compliance, seeing the patient, documenting in the record, and arranging follow ups.   Cynthya Yam Advanced Heart Failure Mechanical Circulatory Support

## 2023-02-23 ENCOUNTER — Ambulatory Visit (HOSPITAL_COMMUNITY)
Admission: RE | Admit: 2023-02-23 | Discharge: 2023-02-23 | Disposition: A | Discharge status: Home / Self Care | Payer: No Typology Code available for payment source | Source: Ambulatory Visit | Attending: Cardiology | Admitting: Cardiology

## 2023-02-23 ENCOUNTER — Other Ambulatory Visit (HOSPITAL_COMMUNITY): Payer: Self-pay

## 2023-02-23 ENCOUNTER — Encounter (HOSPITAL_COMMUNITY): Payer: Self-pay | Admitting: Cardiology

## 2023-02-23 VITALS — BP 98/76 | HR 89 | Ht 72.0 in | Wt 188.8 lb

## 2023-02-23 DIAGNOSIS — I428 Other cardiomyopathies: Secondary | ICD-10-CM | POA: Insufficient documentation

## 2023-02-23 DIAGNOSIS — F17210 Nicotine dependence, cigarettes, uncomplicated: Secondary | ICD-10-CM | POA: Insufficient documentation

## 2023-02-23 DIAGNOSIS — I5040 Unspecified combined systolic (congestive) and diastolic (congestive) heart failure: Secondary | ICD-10-CM | POA: Insufficient documentation

## 2023-02-23 DIAGNOSIS — I10 Essential (primary) hypertension: Secondary | ICD-10-CM

## 2023-02-23 DIAGNOSIS — I110 Hypertensive heart disease with heart failure: Secondary | ICD-10-CM | POA: Diagnosis not present

## 2023-02-23 DIAGNOSIS — Z720 Tobacco use: Secondary | ICD-10-CM | POA: Diagnosis not present

## 2023-02-23 DIAGNOSIS — I5033 Acute on chronic diastolic (congestive) heart failure: Secondary | ICD-10-CM

## 2023-02-23 DIAGNOSIS — Z79899 Other long term (current) drug therapy: Secondary | ICD-10-CM | POA: Insufficient documentation

## 2023-02-23 LAB — BRAIN NATRIURETIC PEPTIDE: B Natriuretic Peptide: 111.1 pg/mL — ABNORMAL HIGH (ref 0.0–100.0)

## 2023-02-23 LAB — BASIC METABOLIC PANEL
Anion gap: 11 (ref 5–15)
BUN: 10 mg/dL (ref 6–20)
CO2: 25 mmol/L (ref 22–32)
Calcium: 9.2 mg/dL (ref 8.9–10.3)
Chloride: 103 mmol/L (ref 98–111)
Creatinine, Ser: 0.95 mg/dL (ref 0.61–1.24)
GFR, Estimated: >60 mL/min (ref >60)
Glucose, Bld: 112 mg/dL — ABNORMAL HIGH (ref 70–99)
Potassium: 4 mmol/L (ref 3.5–5.1)
Sodium: 139 mmol/L (ref 135–145)

## 2023-02-23 MED ORDER — SPIRONOLACTONE 25 MG PO TABS
25.0000 mg | ORAL_TABLET | Freq: Every day | ORAL | 3 refills | Status: DC
  Filled 2023-02-23: qty 30, 30d supply, fill #0
  Filled 2023-04-19: qty 30, 30d supply, fill #1
  Filled 2023-05-30: qty 30, 30d supply, fill #2
  Filled 2023-07-04: qty 30, 30d supply, fill #3

## 2023-02-23 MED ORDER — ATORVASTATIN CALCIUM 40 MG PO TABS
40.0000 mg | ORAL_TABLET | Freq: Every day | ORAL | 3 refills | Status: DC
  Filled 2023-02-23: qty 30, 30d supply, fill #0

## 2023-02-23 MED ORDER — FUROSEMIDE 20 MG PO TABS
20.0000 mg | ORAL_TABLET | Freq: Every day | ORAL | 3 refills | Status: DC
  Filled 2023-02-23: qty 30, 30d supply, fill #0

## 2023-02-23 MED ORDER — ENTRESTO 97-103 MG PO TABS
1.0000 | ORAL_TABLET | Freq: Two times a day (BID) | ORAL | 3 refills | Status: DC
  Filled 2023-02-23: qty 60, 30d supply, fill #0
  Filled 2023-04-19: qty 60, 30d supply, fill #1
  Filled 2023-05-30: qty 60, 30d supply, fill #2
  Filled 2023-07-04: qty 60, 30d supply, fill #3

## 2023-02-23 MED ORDER — DAPAGLIFLOZIN PROPANEDIOL 10 MG PO TABS
10.0000 mg | ORAL_TABLET | Freq: Every day | ORAL | 3 refills | Status: DC
  Filled 2023-02-23: qty 30, 30d supply, fill #0
  Filled 2023-04-19: qty 30, 30d supply, fill #1
  Filled 2023-05-30: qty 30, 30d supply, fill #2
  Filled 2023-07-04: qty 30, 30d supply, fill #3

## 2023-02-23 MED ORDER — CARVEDILOL 12.5 MG PO TABS
12.5000 mg | ORAL_TABLET | Freq: Two times a day (BID) | ORAL | 3 refills | Status: DC
  Filled 2023-02-23: qty 60, 30d supply, fill #0
  Filled 2023-04-19: qty 60, 30d supply, fill #1
  Filled 2023-05-30: qty 60, 30d supply, fill #2
  Filled 2023-07-04: qty 60, 30d supply, fill #3

## 2023-02-23 NOTE — Patient Instructions (Signed)
Medication Changes:  No Changes In Medications at this time.   Lab Work:  Labs done today, your results will be available in MyChart, we will contact you for abnormal readings.  Follow-Up in: 1 month as scheduled with Echo   At the [facility], you and your health needs are our priority. We have a designated team specialized in the treatment of Heart Failure. This Care Team includes your primary Heart Failure Specialized Cardiologist (physician), Advanced Practice Providers (APPs- Physician Assistants and Nurse Practitioners), and Pharmacist who all work together to provide you with the care you need, when you need it.   You may see any of the following providers on your designated Care Team at your next follow up:  Dr. Arvilla Meres Dr. Marca Ancona Dr. Dorthula Nettles Dr. Theresia Bough Tonye Becket, NP Robbie Lis, [place] [facility], [place] Brynda Peon, NP [place] Lee, NP Karle Plumber, PharmD   Please be sure to bring in all your medications bottles to every appointment.   Need to Contact us:  If you have any questions or concerns before your next appointment please send [place] a message through [place] or call our office at [phone].    TO LEAVE A MESSAGE FOR THE NURSE SELECT OPTION 2, PLEASE LEAVE A MESSAGE INCLUDING: YOUR NAME DATE OF BIRTH CALL BACK NUMBER REASON FOR CALL**this is important as we prioritize the call backs  YOU WILL RECEIVE A CALL BACK THE SAME DAY AS LONG AS YOU CALL BEFORE 4:00 PM

## 2023-03-01 ENCOUNTER — Other Ambulatory Visit (HOSPITAL_COMMUNITY): Payer: Self-pay

## 2023-03-22 ENCOUNTER — Telehealth (HOSPITAL_COMMUNITY): Payer: Self-pay | Admitting: Cardiology

## 2023-03-22 NOTE — Progress Notes (Incomplete)
ADVANCED HEART FAILURE CLINIC NOTE  Referring Physician: Renford Dills, MD  Primary Care: Renford Dills, MD Primary Cardiologist:  HPI: John Velasquez is a 58 y.o. male with uncontrolled hypertension, tobacco use and recently diagnosed systolic heart failure presenting today to establish care.  I saw Mr. Suarez at [facility] last week after he presented with a 2-week history of substernal chest pressure, shortness of breath and paroxysmal nocturnal dyspnea.  During that admission he was found to have an LVEF of 20 to 25%, cardiac index of 2.1 by right heart cath and no angiographically significant coronary artery disease.  Mr. Seiber was started on appropriate GDMT, diuresed and discharged home.  In addition he had a cardiac MRI that was largely unremarkable and consistent with a forementioned echo.  Interval history - He has been doing much better with medication compliance after our extensive discussion prevoiusly.  -Doing very well from a functional standpoint; he has had two episodes of significant lightheadedness. The first was shortly after restarting medications and the last one was roughly 1-2 weeks ago. No further episodes since that time.   Activity level/exercise tolerance: NYHA II Orthopnea:  Sleeps on 1-2 pillows Paroxysmal noctural dyspnea: No Chest pain/pressure: No Orthostatic lightheadedness: No Palpitations: No Lower extremity edema: No Presyncope/syncope: No Cough: No  Past Medical History:  Diagnosis Date   Cellulitis 04/13/2017   BILATERAL FEET   Dyslipidemia 12/25/2022   Hypertension     Current Outpatient Medications  Medication Sig Dispense Refill   atorvastatin (LIPITOR) 40 MG tablet Take 1 tablet (40 mg total) by mouth daily. 30 tablet 3   carvedilol (COREG) 12.5 MG tablet Take 1 tablet (12.5 mg total) by mouth 2 (two) times daily with a meal. 60 tablet 3   dapagliflozin propanediol (FARXIGA) 10 MG TABS tablet Take 1 tablet (10 mg total)  by mouth daily. 30 tablet 3   furosemide (LASIX) 20 MG tablet Take 1 tablet (20 mg total) by mouth daily. 30 tablet 3   sacubitril-valsartan (ENTRESTO) 97-103 MG Take 1 tablet by mouth 2 (two) times daily. 60 tablet 3   spironolactone (ALDACTONE) 25 MG tablet Take 1 tablet (25 mg total) by mouth daily. 30 tablet 3   No current facility-administered medications for this visit.     PHYSICAL EXAM: There were no vitals filed for this visit. GENERAL: NAD Lungs- *** CARDIAC:  JVP: *** cm          Normal rate with regular rhythm. *** murmur.  Pulses ***. *** edema.  ABDOMEN: Soft, non-tender, non-distended.  EXTREMITIES: Warm and well perfused.  NEUROLOGIC: No obvious FND   DATA REVIEW  ECG: 11/22/21: NSR w/ PAC  ECHO: 11/07/21: LVEF 20-25%, Grade II DD, normal RV function; severely dilated LA.  12/25/22: LVEF 20-25%,   CATH: 11/08/21: HEMODYNAMICS: RA:                  2 mmHg (mean) RV:                  18 mmHg PA:                  14/5 mmHg (8 mean) PCWP:            7 mmHg (mean) LVEDP: 3-5                                      Estimated  Fick CO/CI   4.25 L/min, 2.07 L/min/m2                                          TPG                 1  mmHg                                              PVR                 <1 Wood Units  PAPi                4.5       IMPRESSION: 1.           Low pre and post capillary filling pressures, PCWP or PA diastolic likely underestimated.  2.         Moderately reduced cardiac index 3.         Normal PVR 4.         Right dominant coronary circulation with no angiography significant CAD.   CMR: 11/08/21: 1.  Mild LV dilation with EF 16%, global hypokinesis.  2.  Normal RV size with EF 27%.  3. Non-coronary LGE pattern with mid-wall LGE in the basal to mid septum and mid-wall and subepicardial LGE in the basal to mid lateral wall. This could suggest prior myocarditis. T2 signal not elevated in the septum or lateral wall, making acute  myocarditis less likely.  4. ECV percentage mildly elevated at 35, this range is not suggestive of cardiac amyloidosis (suggestive when > 40).  ASSESSMENT & PLAN:  NYHA II, Stage C, Nonischemic cardiomyopathy / HFrEF Etiology of HY:QMVHQI 2/2 hypertensive cardiomyopathy; LHC unremarkable; mildly reduced CI by RHC. No significant family history of heart disease.  NYHA class / AHA Stage:II Volume status & Diuretics: Euvolemic, taking lasix 20mg  PO daily.  Vasodilators:Continue Entresto 97/103 BID; BP at goal today.  Beta-Blocker: Continue Coreg to 12.5mg  BID MRA: Continue spironolactone 25mg  daily Cardiometabolic: Continue Farxiga Devices therapies & Valvulopathies: TTE w/ LVEF of 25% ; repeat TTE at follow up.  Advanced therapies: Not indicated  2.  Hypertension -Blood pressure at goal today; possibly slightly too hypotensive; however, we will continue meds at current doses.  - Repeat BMP/BNP today.   3.  Tobacco use -He has continued to smoke; reduced to 5-6 cigarettes daily.  -He reports that the patches are not working now but will continue to try to cut down.  -Does not drink alcohol.   I spent 38-42 minutes caring for this patient today including face to face time, ordering and reviewing labs, reviewing records from hospitalization in November 2024, extensive counseling on medication compliance, seeing the patient, documenting in the record, and arranging follow ups.   Xzavien Harada Advanced Heart Failure Mechanical Circulatory Support

## 2023-03-22 NOTE — Telephone Encounter (Signed)
Called patient at [phone] to remind patient of his echocardiogram appt and Dr. Gasper Lloyd appt on Friday 03/23/23.  Front office confirmed appt with patient over the telephone on 03/22/23 for both echo and f/u with Dr. Gasper Lloyd on Friday 03/23/23.

## 2023-03-23 ENCOUNTER — Ambulatory Visit (HOSPITAL_BASED_OUTPATIENT_CLINIC_OR_DEPARTMENT_OTHER)
Admission: RE | Admit: 2023-03-23 | Discharge: 2023-03-23 | Disposition: A | Discharge status: Home / Self Care | Payer: BC Managed Care – PPO | Source: Ambulatory Visit | Attending: Cardiology | Admitting: Cardiology

## 2023-03-23 ENCOUNTER — Ambulatory Visit (HOSPITAL_COMMUNITY)
Admission: RE | Admit: 2023-03-23 | Discharge: 2023-03-23 | Disposition: A | Discharge status: Home / Self Care | Payer: BC Managed Care – PPO | Source: Ambulatory Visit | Attending: Cardiology | Admitting: Cardiology

## 2023-03-23 VITALS — BP 104/80 | HR 73 | Wt 194.2 lb

## 2023-03-23 DIAGNOSIS — E785 Hyperlipidemia, unspecified: Secondary | ICD-10-CM | POA: Diagnosis not present

## 2023-03-23 DIAGNOSIS — I5022 Chronic systolic (congestive) heart failure: Secondary | ICD-10-CM | POA: Diagnosis not present

## 2023-03-23 DIAGNOSIS — Z79899 Other long term (current) drug therapy: Secondary | ICD-10-CM | POA: Diagnosis not present

## 2023-03-23 DIAGNOSIS — F17200 Nicotine dependence, unspecified, uncomplicated: Secondary | ICD-10-CM | POA: Diagnosis not present

## 2023-03-23 DIAGNOSIS — R42 Dizziness and giddiness: Secondary | ICD-10-CM | POA: Diagnosis not present

## 2023-03-23 DIAGNOSIS — I110 Hypertensive heart disease with heart failure: Secondary | ICD-10-CM | POA: Insufficient documentation

## 2023-03-23 DIAGNOSIS — I5040 Unspecified combined systolic (congestive) and diastolic (congestive) heart failure: Secondary | ICD-10-CM | POA: Diagnosis not present

## 2023-03-23 DIAGNOSIS — I5023 Acute on chronic systolic (congestive) heart failure: Secondary | ICD-10-CM | POA: Diagnosis not present

## 2023-03-23 DIAGNOSIS — I10 Essential (primary) hypertension: Secondary | ICD-10-CM

## 2023-03-23 DIAGNOSIS — Z720 Tobacco use: Secondary | ICD-10-CM

## 2023-03-23 DIAGNOSIS — I428 Other cardiomyopathies: Secondary | ICD-10-CM | POA: Insufficient documentation

## 2023-03-23 LAB — HEMOGLOBIN A1C
Hgb A1c MFr Bld: 6.3 % — ABNORMAL HIGH (ref 4.8–5.6)
Mean Plasma Glucose: 134.11 mg/dL

## 2023-03-23 LAB — LIPID PANEL
Cholesterol: 145 mg/dL (ref 0–200)
HDL: 57 mg/dL (ref >40)
LDL Cholesterol: 64 mg/dL (ref 0–99)
Total CHOL/HDL Ratio: 2.5 {ratio}
Triglycerides: 122 mg/dL (ref <150)
VLDL: 24 mg/dL (ref 0–40)

## 2023-03-23 LAB — BASIC METABOLIC PANEL
Anion gap: 12 (ref 5–15)
BUN: 23 mg/dL — ABNORMAL HIGH (ref 6–20)
CO2: 27 mmol/L (ref 22–32)
Calcium: 9.5 mg/dL (ref 8.9–10.3)
Chloride: 101 mmol/L (ref 98–111)
Creatinine, Ser: 1.05 mg/dL (ref 0.61–1.24)
GFR, Estimated: >60 mL/min (ref >60)
Glucose, Bld: 101 mg/dL — ABNORMAL HIGH (ref 70–99)
Potassium: 5.1 mmol/L (ref 3.5–5.1)
Sodium: 140 mmol/L (ref 135–145)

## 2023-03-23 LAB — ECHOCARDIOGRAM COMPLETE
AR max vel: 2.43 cm2
AV Area VTI: 2.47 cm2
AV Area mean vel: 2.31 cm2
AV Mean grad: 3 mm[Hg]
AV Peak grad: 4 mm[Hg]
Ao pk vel: 1 m/s
Area-P 1/2: 3.53 cm2
S' Lateral: 4.4 cm

## 2023-03-23 LAB — BRAIN NATRIURETIC PEPTIDE: B Natriuretic Peptide: 66.5 pg/mL (ref 0.0–100.0)

## 2023-03-23 MED ORDER — PERFLUTREN LIPID MICROSPHERE
1.0000 mL | INTRAVENOUS | Status: DC | PRN
Start: 2023-03-23 — End: 2023-03-23
  Administered 2023-03-23: 2 mL via INTRAVENOUS

## 2023-03-23 NOTE — Patient Instructions (Addendum)
Medication Changes:  No Changes In Medications at this time.   Lab Work:  Labs done today, your results will be available in MyChart, we will contact you for abnormal readings.  Follow-Up in: 1 month as scheduled with Pharmacy  Then again in   2 months with Echo PLEASE CALL OUR OFFICE AROUND mid march TO GET SCHEDULED FOR YOUR APPOINTMENT. PHONE NUMBER IS [phone] OPTION 2   At the [facility], you and your health needs are our priority. We have a designated team specialized in the treatment of Heart Failure. This Care Team includes your primary Heart Failure Specialized Cardiologist (physician), Advanced Practice Providers (APPs- Physician Assistants and Nurse Practitioners), and Pharmacist who all work together to provide you with the care you need, when you need it.   You may see any of the following providers on your designated Care Team at your next follow up:  Dr. Arvilla Meres Dr. Marca Ancona Dr. Dorthula Nettles Dr. Theresia Bough Tonye Becket, NP Robbie Lis, [place] [facility], [place] Brynda Peon, NP [place] Lee, NP Karle Plumber, PharmD   Please be sure to bring in all your medications bottles to every appointment.   Need to Contact us:  If you have any questions or concerns before your next appointment please send [place] a message through [place] or call our office at [phone].    TO LEAVE A MESSAGE FOR THE NURSE SELECT OPTION 2, PLEASE LEAVE A MESSAGE INCLUDING: YOUR NAME DATE OF BIRTH CALL BACK NUMBER REASON FOR CALL**this is important as we prioritize the call backs  YOU WILL RECEIVE A CALL BACK THE SAME DAY AS LONG AS YOU CALL BEFORE 4:00 PM

## 2023-04-18 NOTE — Progress Notes (Incomplete)
 ***In Progress***    Advanced Heart Failure Clinic Note  Referring Physician: Renford Dills, MD  Primary Care: Renford Dills, MD HF: Dr. Gasper Lloyd  HPI:  John Velasquez is a 58 y.o. male with uncontrolled hypertension, tobacco use and recently diagnosed systolic heart failure presenting today to establish care.  Mr. Devan was admitted at [facility]  in Oct 2023 presenting with a 2-week history of substernal chest pressure, shortness of breath and paroxysmal nocturnal dyspnea.  During that admission he was found to have an LVEF of 20 to 25%, cardiac index of 2.1 by right heart cath and no angiographically significant coronary artery disease.  Mr. Rayford was started on appropriate GDMT, diuresed and discharged home.  In addition he had a cardiac MRI with non coronary LGE pattern concerning for old myocarditis. He was admitted again in Nov 2024 with AHF and hypertensive urgency, in the setting of being off his medications for 10 months due to cost. He was diuresed with IV Lasix, restarted on GDMT, and discharged. EF at that time was 20-25% with global hypokinesis, mild LVH, normal RV.   He was last seen by Dr. Gasper Lloyd on 03/23/23 at which time he reported resuming work. Denies ShOB, LE edema, PND, or palpitations. His only complete was lightheadedness ~ once/weekly which happens with positional changes. He had a repeat TTE demonstrating EF 30-35%, global hypoK, G1DD, normal RV. No changes to his medications were made. Planning to make a decision about ICD placement after repeat TTE ~ Apr 2025.   Today he returns to HF clinic for pharmacist medication titration. At last visit with MD ***.   Today he returns to HF clinic for pharmacist medication titration. At last visit with MD ***.   Overall feeling ***. Dizziness, lightheadedness, fatigue:  Chest pain or palpitations:  How is your breathing?: *** SOB: Able to complete all ADLs. Activity level ***  Weight at home pounds. Takes  furosemide/torsemide/bumex *** mg *** daily.  LEE PND/Orthopnea  Appetite *** Low-salt diet:   Physical Exam Cost/affordability of meds   Last BP 104/80, R 73,  Paramedicine - sest up today Repeat BMP for elevated K? May need to dose-reduce spiro Could consider increasing carvedilol - but check adherence first, and reevaluate dizziness Using [facility] pharmacy  HF Medications: Carvedilol 12.5 mg BID (last filled 03/01/23 for 30 ds) Entresto 97/103 mg BID (same) Spironolactone 25 mg daily (last filled 03/22/23 for 90ds at [facility] w atorvastatin) Farxiga 10 mg daily (same) Lasix 20 mg daily (same [facility] fill)  Has the patient been experiencing any side effects to the medications prescribed?  {YES NO:22349}  Does the patient have any problems obtaining medications due to transportation or finances?   {YES NO:22349}  Understanding of regimen: {excellent/good/fair/poor:19665} Understanding of indications: {excellent/good/fair/poor:19665} Potential of compliance: {excellent/good/fair/poor:19665} Patient understands to avoid NSAIDs. Patient understands to avoid decongestants.    Pertinent Lab Values: 03/23/23: Serum creatinine 1.05, BUN 23, Potassium 5.1, Sodium 140, BNP 66  Vital Signs: Weight: *** (last clinic weight: 194.2 lbs) Blood pressure: ***  Heart rate: ***   Assessment/Plan: 1. Chronic systolic CHF (EF 91-47% in Feb 2025, previously 20-25%), due to nonischemic cardiomyopathy likely due to hypertensive cardiomyopathy. LHC unremarkable; mildly reduced CI by RHC. No significant family history of heart disease. NYHA class *** symptoms.  NYHA II, Stage C, Nonischemic cardiomyopathy / HFrEF Etiology of WG:NFAOZH 2/2 hypertensive cardiomyopathy; LHC unremarkable; mildly reduced CI by RHC. No significant family history of heart disease.  NYHA class / AHA  Stage:II Volume status & Diuretics: Euvolemic, taking lasix 20mg  daily Vasodilators:Continue Entresto 97/103 BID; BP at goal today.   Beta-Blocker: Continue Coreg to 12.5mg  BID; HR only slightly above goal today. Will plan on follow up with pharmD in 1 month for further med uptitration before decision on ICD placement the following month.  MRA: Continue spironolactone 25mg  daily Cardiometabolic: Continue Farxiga Devices therapies & Valvulopathies: Prior TTE with LVEF 15-20%; although, LVEF remains severely reduced it has improved slightly. We discussed primary prevention ICD; however, will wait another 2 months with repeat TTE before pursing ICD placement.  Advanced therapies: Not indicated   2.  Hypertension -BP at goal - BMP/BNP today   3.  Tobacco use -Continues to smoke; we discussed importance of cessation -He reports that the patches are not working now but will continue to try to cut down.  -Does not drink alcohol.    4. Hyperlipidemia - LDL 106 in 11/07/21, improved to 64 mg/dL on Feb 2025 lipid panel.    - Basic disease state pathophysiology, medication indication, mechanism and side effects reviewed at length with patient and he verbalized understanding  Follow up ***   Karle Plumber, PharmD, BCPS, BCCP, CPP Heart Failure Clinic Pharmacist [phone]

## 2023-04-19 ENCOUNTER — Ambulatory Visit (HOSPITAL_COMMUNITY)
Admission: RE | Admit: 2023-04-19 | Discharge: 2023-04-19 | Disposition: A | Discharge status: Home / Self Care | Payer: BC Managed Care – PPO | Source: Ambulatory Visit | Attending: Cardiology | Admitting: Cardiology

## 2023-04-19 ENCOUNTER — Other Ambulatory Visit (HOSPITAL_COMMUNITY): Payer: Self-pay

## 2023-04-19 VITALS — BP 148/94 | HR 63 | Wt 196.0 lb

## 2023-04-19 DIAGNOSIS — I5023 Acute on chronic systolic (congestive) heart failure: Secondary | ICD-10-CM

## 2023-04-19 DIAGNOSIS — I110 Hypertensive heart disease with heart failure: Secondary | ICD-10-CM | POA: Diagnosis not present

## 2023-04-19 DIAGNOSIS — I5022 Chronic systolic (congestive) heart failure: Secondary | ICD-10-CM | POA: Insufficient documentation

## 2023-04-19 DIAGNOSIS — I428 Other cardiomyopathies: Secondary | ICD-10-CM | POA: Diagnosis not present

## 2023-04-19 DIAGNOSIS — Z79899 Other long term (current) drug therapy: Secondary | ICD-10-CM | POA: Insufficient documentation

## 2023-04-19 DIAGNOSIS — I5033 Acute on chronic diastolic (congestive) heart failure: Secondary | ICD-10-CM

## 2023-04-19 DIAGNOSIS — E785 Hyperlipidemia, unspecified: Secondary | ICD-10-CM | POA: Diagnosis not present

## 2023-04-19 DIAGNOSIS — Z91148 Patient's other noncompliance with medication regimen for other reason: Secondary | ICD-10-CM | POA: Insufficient documentation

## 2023-04-19 DIAGNOSIS — Z760 Encounter for issue of repeat prescription: Secondary | ICD-10-CM | POA: Diagnosis not present

## 2023-04-19 DIAGNOSIS — F17210 Nicotine dependence, cigarettes, uncomplicated: Secondary | ICD-10-CM | POA: Insufficient documentation

## 2023-04-19 MED ORDER — FUROSEMIDE 20 MG PO TABS
20.0000 mg | ORAL_TABLET | Freq: Every day | ORAL | 3 refills | Status: DC | PRN
  Filled 2023-04-19: qty 30, 30d supply, fill #0
  Filled 2023-05-30: qty 30, 30d supply, fill #1
  Filled 2023-07-04: qty 30, 30d supply, fill #2
  Filled 2023-08-06: qty 30, 30d supply, fill #3

## 2023-04-19 MED ORDER — ATORVASTATIN CALCIUM 40 MG PO TABS
40.0000 mg | ORAL_TABLET | Freq: Every day | ORAL | 3 refills | Status: DC
  Filled 2023-04-19: qty 30, 30d supply, fill #0
  Filled 2023-05-30: qty 30, 30d supply, fill #1
  Filled 2023-07-04: qty 30, 30d supply, fill #2
  Filled 2023-08-06: qty 30, 30d supply, fill #3

## 2023-04-19 NOTE — Progress Notes (Signed)
 Advanced Heart Failure Clinic Note   Referring Physician: Renford Dills, MD  Primary Care: Renford Dills, MD HF: Dr. Gasper Lloyd  HPI:  John Velasquez is a 58 y.o. male with uncontrolled hypertension, tobacco use and systolic heart failure.  John Velasquez was admitted at [facility]  in Oct 2023 presenting with a 2-week history of substernal chest pressure, shortness of breath and paroxysmal nocturnal dyspnea.  During that admission he was found to have an LVEF of 20 to 25%, cardiac index of 2.1 by right heart cath and no angiographically significant coronary artery disease.  John Velasquez was started on appropriate GDMT, diuresed and discharged home.  In addition he had a cardiac MRI with non coronary LGE pattern concerning for old myocarditis. He was admitted again in Nov 2024 with AHF and hypertensive urgency, in the setting of being off his medications for 10 months due to cost. He was diuresed with IV Lasix, restarted on GDMT, and discharged. EF at that time was 20-25% with global hypokinesis, mild LVH, normal RV.   He was last seen by Dr. Gasper Lloyd on 03/23/23 at which time he reported resuming work. Denied SOB, LE edema, PND, or palpitations. His only complaint was lightheadedness ~ once/weekly which happens with positional changes. He had a repeat TTE demonstrating EF 30-35%, global hypoK, G1DD, normal RV. No changes to his medications were made. Planning to make a decision about ICD placement after repeat TTE ~ Apr 2025.   Today he returns to HF clinic for pharmacist medication titration. At last visit with MD, no medication changes were made. Patient reports overall feeling well today. He reports that he did have a syncopal event a couple weeks ago, after he saw Dr. Gasper Lloyd. Describes the event as a "blackout" - he was helping his mother, started feeling dizzy, fell to the ground. Reports that he hit his elbow on the table going down, but he quickly jumped up. After sitting for a couple  minutes, the dizziness subsided and he started to feel better. Denies subsequent dizziness, lightheadedness, fatigue. Denies CP, palpitations. He is not having SOB - he is very active at work (walking, operating a machine) and denies activity limitation. No LEE on exam. Reports that he is monitoring his weight daily, and it has been stable. Reports that he does notice swelling in his hands/fingers, but denies fluid build up in his lower extremities or abdomen. Appetite is good. He is attempting to follow a low-sodium diet, but is eating potato chips every day. He denies affordability issues with his medications, but states he has been out of medications for the past week. He would like to fill these at [facility] today. He was not aware that there were refills available on his medications. Prior to running out, he denies missing any doses. He takes all his once daily medications out of the bottles in the morning around 7-8 AM. He packs his second dose of carvedilol in a once daily pill box, which he brings to work, and takes ~3 PM. He takes his second dose of Entresto when he gets home from work which is usually between 9-11:30PM.   HF Medications: Out of all medications for the past week Carvedilol 12.5 mg BID  Entresto 97/103 mg BID  Spironolactone 25 mg daily  Farxiga 10 mg daily  Lasix 20 mg daily   Has the patient been experiencing any side effects to the medications prescribed? Yes - possible orthostatic hypotension.  Does the patient have any problems obtaining  medications due to transportation or finances? No; commercial insurance  Understanding of regimen: fair Understanding of indications: good Potential of compliance: fair - educated on obtaining refills today Patient understands to avoid NSAIDs. Patient understands to avoid decongestants.    Pertinent Lab Values: 03/23/23: Serum creatinine 1.05, BUN 23, Potassium 5.1 (hemolysis may have affected results), Sodium 140, BNP 66  Vital  Signs: Weight: 196.0 lbs (last clinic weight: 194.2 lbs) Blood pressure: 148/94 mmHg  Heart rate: 63 bpm  Assessment/Plan: 1. Chronic systolic CHF (EF 16-10% in Feb 2025, previously 20-25%), due to nonischemic cardiomyopathy likely due to hypertensive cardiomyopathy. LHC unremarkable; mildly reduced CI by RHC. No significant family history of heart disease. NYHA class I-II symptoms, Stage C. Euvolemic on exam today without activity limitation. Primary issue is medication non-adherence, as patient was not aware of how to obtain refills from the pharmacy. Educated today. Given reported syncopal event, and euvolemia with stable weight at home, will transition Lasix to PRN. Pending symptoms and HR at follow-up, could consider titration of carvedilol to 1.5 tabs (18.75 mg) BID.  - Decrease Lasix to 20 mg PRN for fluid. Educated patient to take Lasix if he notices 2-3 lbs weight gain in one day or 5 lbs in one week.  - Continue carvedilol 12.5 mg BID - Continue Entresto 97-103 mg BID - Continue spironolactone 25 mg daily - Continue Farxiga 10 mg daily - Plan to repeat TTE in April 2025 to assess eligibility for primary prevention ICD placement. Educated patient on importance of adherence to improve EF and decrease risk of SCD.    2.  Hypertension - BP elevated above goal < 130/80 mmHg today in the setting of being out of his medications. Patient will plan to pick up refills after visit today. Communicated with pharmacy, and provided refills as appropriate.  - Continue medications as above.    3.  Tobacco use -Continues to smoke; smoking cessation discussed at previous visit on 03/23/23.  -He reported that the patches are not working now but will continue to try to cut down.  -Does not drink alcohol.    4. Hyperlipidemia - LDL 106 in 11/07/21, improved to 64 mg/dL on Feb 2025 lipid panel.  - Continue atorvastatin 40 mg daily. Provided refill today.   Follow up 05/15/23 with Dr. Marisue Humble, PharmD PGY1 Pharmacy Resident  Karle Plumber, PharmD, BCPS, [facility], CPP Heart Failure Clinic Pharmacist [phone]

## 2023-04-19 NOTE — Patient Instructions (Signed)
 It was a pleasure seeing you today!  MEDICATIONS: -We are changing your medications today -Decrease Lasix (furosemide) to 20 mg AS NEEDED for fluid (2-3 lbs weight gain in one day or 5 lbs in one week). If you are needing Lasix frequently, please call the office.  -Continue all other medications. If you run out before your next visit, please ask the pharmacy for refills.  -Call if you have questions about your medications.  LABS: -We will call you if your labs need attention.  NEXT APPOINTMENT: Return to clinic in 1 mo with Dr. Gasper Lloyd.  In general, to take care of your heart failure: -Limit your fluid intake to 2 Liters (half-gallon) per day.   -Limit your salt intake to ideally 2-3 grams (2000-3000 mg) per day. -Weigh yourself daily and record, and bring that "weight diary" to your next appointment.  (Weight gain of 2-3 pounds in 1 day typically means fluid weight.) -The medications for your heart are to help your heart and help you live longer.   -Please contact us before stopping any of your heart medications.  Call the clinic at [phone] with questions or to reschedule future appointments.

## 2023-05-14 ENCOUNTER — Telehealth (HOSPITAL_COMMUNITY): Payer: Self-pay | Admitting: Cardiology

## 2023-05-14 NOTE — Telephone Encounter (Signed)
 Called to confirm/remind patient of their appointment at the Advanced Heart Failure Clinic on 05/14/23 .   Appointment:   [] Confirmed  [x] Left mess   [] No answer/No voice mail  [] Phone not in service  Patient reminded to bring all medications and/or complete list.  Confirmed patient has transportation. Gave directions, instructed to utilize valet parking.

## 2023-05-14 NOTE — Progress Notes (Signed)
 ADVANCED HEART FAILURE CLINIC NOTE  Referring Physician: Renford Dills, MD  Primary Care: Renford Dills, MD HF: Dr. Gasper Lloyd  CC: Chronic systolic heart failure  HPI: John Velasquez is a 58 y.o. male with uncontrolled hypertension, tobacco use and recently diagnosed systolic heart failure presenting today to establish care.  I saw Mr. John Velasquez at [facility] after he presented with a 2-week history of substernal chest pressure, shortness of breath and paroxysmal nocturnal dyspnea.  During that admission he was found to have an LVEF of 20 to 25%, cardiac index of 2.1 by right heart cath and no angiographically significant coronary artery disease.  Mr. John Velasquez was started on appropriate GDMT, diuresed and discharged home.  In addition he had a cardiac MRI with non coronary LGE pattern concerning for old myocarditis   Interval history - Reports that he has been compliant with all medications. He has had no shortness of breath, LE edema, chest pain.  - He has had one syncopal episode due to orthostatic hypotension. Continues to have lightheadedness 1-2x weekly.   Activity level/exercise tolerance: NYHA II Orthopnea:  Sleeps on 1-2 pillows Paroxysmal noctural dyspnea: No Chest pain/pressure: No Orthostatic lightheadedness: infrequent Palpitations: No Lower extremity edema: No Presyncope/syncope: No Cough: No  Past Medical History:  Diagnosis Date   Cellulitis 04/13/2017   BILATERAL FEET   Dyslipidemia 12/25/2022   Hypertension     Current Outpatient Medications  Medication Sig Dispense Refill   atorvastatin (LIPITOR) 40 MG tablet Take 1 tablet (40 mg total) by mouth daily. 30 tablet 3   carvedilol (COREG) 12.5 MG tablet Take 1 tablet (12.5 mg total) by mouth 2 (two) times daily with a meal. 60 tablet 3   dapagliflozin propanediol (FARXIGA) 10 MG TABS tablet Take 1 tablet (10 mg total) by mouth daily. 30 tablet 3   furosemide (LASIX) 20 MG tablet Take 1 tablet (20 mg  total) by mouth daily as needed for fluid. 30 tablet 3   sacubitril-valsartan (ENTRESTO) 97-103 MG Take 1 tablet by mouth 2 (two) times daily. 60 tablet 3   spironolactone (ALDACTONE) 25 MG tablet Take 1 tablet (25 mg total) by mouth daily. 30 tablet 3   No current facility-administered medications for this encounter.     PHYSICAL EXAM: Vitals:   05/15/23 0835  BP: 102/70  Pulse: 68  SpO2: 97%   GENERAL: NAD Lungs- CTA CARDIAC:  JVP: 7 cm          Normal rate with regular rhythm. No murmur.  Pulses 2+. No edema.  ABDOMEN: Soft, non-tender, non-distended.  EXTREMITIES: Warm and well perfused.  NEUROLOGIC: No obvious FND    DATA REVIEW  ECG: 11/22/21: NSR w/ PAC  ECHO: 11/07/21: LVEF 20-25%, Grade II DD, normal RV function; severely dilated LA.  12/25/22: LVEF 20-25%,  05/15/23: LVEF 50%  CATH: 11/08/21: HEMODYNAMICS: RA:                  2 mmHg (mean) RV:                  18 mmHg PA:                  14/5 mmHg (8 mean) PCWP:            7 mmHg (mean) LVEDP: 3-5  Estimated Fick CO/CI   4.25 L/min, 2.07 L/min/m2                                          TPG                 1  mmHg                                              PVR                 <1 Wood Units  PAPi                4.5       IMPRESSION: 1.           Low pre and post capillary filling pressures, PCWP or PA diastolic likely underestimated.  2.         Moderately reduced cardiac index 3.         Normal PVR 4.         Right dominant coronary circulation with no angiography significant CAD.   CMR: 11/08/21: 1.  Mild LV dilation with EF 16%, global hypokinesis.  2.  Normal RV size with EF 27%.  3. Non-coronary LGE pattern with mid-wall LGE in the basal to mid septum and mid-wall and subepicardial LGE in the basal to mid lateral wall. This could suggest prior myocarditis. T2 signal not elevated in the septum or lateral wall, making acute myocarditis less likely.  4.  ECV percentage mildly elevated at 35, this range is not suggestive of cardiac amyloidosis (suggestive when > 40).  ASSESSMENT & PLAN:  NYHA II, Stage C, Nonischemic cardiomyopathy / HFrEF Etiology of UJ:WJXBJY 2/2 hypertensive cardiomyopathy; LHC unremarkable; mildly reduced CI by RHC. No significant family history of heart disease.  NYHA class / AHA Stage:II Volume status & Diuretics: Euvolemic to hypovolemic on exam; will decrease lasix 20mg  every other day.  Vasodilators:Continue Entresto 97/103 BID; BP at goal today.  Beta-Blocker: Continue Coreg to 12.5mg  BID; HR at goal today.  MRA: Continue spironolactone 25mg  daily. Repeat labs.  Cardiometabolic: Continue Farxiga Devices therapies & Valvulopathies: Recovery of LVEF on echo today. 45-50%.  Advanced therapies: Not indicated  2.  Hypertension -BP at goal - BMP/BNP today  3.  Tobacco use -Continues to smoke. -Once more discussed importance of smoking cessation. Reports that patches do not help.   4. Hyperlipidemia - LDL 106 in 11/07/21 - LDL down to 64 in 3/25.   I spent 35 minutes caring for this patient today including face to face time, ordering and reviewing labs, reviewing records echocardiogram with him, counseling on importance of medication compliance, seeing the patient, documenting in the record, and arranging follow ups.   Kerney Hopfensperger Advanced Heart Failure Mechanical Circulatory Support

## 2023-05-15 ENCOUNTER — Ambulatory Visit (HOSPITAL_BASED_OUTPATIENT_CLINIC_OR_DEPARTMENT_OTHER)
Admission: RE | Admit: 2023-05-15 | Discharge: 2023-05-15 | Disposition: A | Discharge status: Home / Self Care | Source: Ambulatory Visit | Attending: Cardiology | Admitting: Cardiology

## 2023-05-15 ENCOUNTER — Encounter (HOSPITAL_COMMUNITY): Payer: Self-pay | Admitting: Cardiology

## 2023-05-15 ENCOUNTER — Ambulatory Visit (HOSPITAL_COMMUNITY)
Admission: RE | Admit: 2023-05-15 | Discharge: 2023-05-15 | Disposition: A | Discharge status: Home / Self Care | Source: Ambulatory Visit | Attending: Cardiology | Admitting: Cardiology

## 2023-05-15 VITALS — BP 102/70 | HR 68 | Wt 197.2 lb

## 2023-05-15 DIAGNOSIS — I5022 Chronic systolic (congestive) heart failure: Secondary | ICD-10-CM

## 2023-05-15 DIAGNOSIS — I5040 Unspecified combined systolic (congestive) and diastolic (congestive) heart failure: Secondary | ICD-10-CM | POA: Diagnosis not present

## 2023-05-15 DIAGNOSIS — F17210 Nicotine dependence, cigarettes, uncomplicated: Secondary | ICD-10-CM | POA: Diagnosis not present

## 2023-05-15 DIAGNOSIS — I110 Hypertensive heart disease with heart failure: Secondary | ICD-10-CM | POA: Insufficient documentation

## 2023-05-15 DIAGNOSIS — Z720 Tobacco use: Secondary | ICD-10-CM

## 2023-05-15 DIAGNOSIS — I10 Essential (primary) hypertension: Secondary | ICD-10-CM | POA: Diagnosis not present

## 2023-05-15 DIAGNOSIS — E861 Hypovolemia: Secondary | ICD-10-CM | POA: Insufficient documentation

## 2023-05-15 DIAGNOSIS — Z79899 Other long term (current) drug therapy: Secondary | ICD-10-CM | POA: Insufficient documentation

## 2023-05-15 DIAGNOSIS — I428 Other cardiomyopathies: Secondary | ICD-10-CM | POA: Diagnosis not present

## 2023-05-15 DIAGNOSIS — E785 Hyperlipidemia, unspecified: Secondary | ICD-10-CM | POA: Insufficient documentation

## 2023-05-15 DIAGNOSIS — Z7984 Long term (current) use of oral hypoglycemic drugs: Secondary | ICD-10-CM | POA: Insufficient documentation

## 2023-05-15 DIAGNOSIS — Z006 Encounter for examination for normal comparison and control in clinical research program: Secondary | ICD-10-CM

## 2023-05-15 LAB — ECHOCARDIOGRAM COMPLETE
AR max vel: 2.77 cm2
AV Peak grad: 5.4 mmHg
Ao pk vel: 1.16 m/s
Area-P 1/2: 2.1 cm2
Calc EF: 46.4 %
MV VTI: 3.67 cm2
S' Lateral: 4.1 cm
Single Plane A2C EF: 44.8 %
Single Plane A4C EF: 49.6 %

## 2023-05-15 LAB — BRAIN NATRIURETIC PEPTIDE: B Natriuretic Peptide: 33.8 pg/mL (ref 0.0–100.0)

## 2023-05-15 MED ORDER — FUROSEMIDE 20 MG PO TABS: 20.0000 mg | ORAL_TABLET | ORAL | Status: DC

## 2023-05-15 NOTE — Patient Instructions (Signed)
 CHANGE Lasix to 20 mg every other day.  Labs done today, your results will be available in MyChart, we will contact you for abnormal readings.  Your physician recommends that you schedule a follow-up appointment in: 4 months (August ) ** PLEASE CALL THE OFFICE JUNE TO ARRANGE YOUR FOLLOW UP APPOINTMENT.**  If you have any questions or concerns before your next appointment please send us  a message through [place] or call our office at [phone].    TO LEAVE A MESSAGE FOR THE NURSE SELECT OPTION 2, PLEASE LEAVE A MESSAGE INCLUDING: YOUR NAME DATE OF BIRTH CALL BACK NUMBER REASON FOR CALL**this is important as we prioritize the call backs  YOU WILL RECEIVE A CALL BACK THE SAME DAY AS LONG AS YOU CALL BEFORE 4:00 PM  At the Advanced Heart Failure Clinic, you and your health needs are our priority. As part of our continuing mission to provide you with exceptional heart care, we have created designated Provider Care Teams. These Care Teams include your primary Cardiologist (physician) and Advanced Practice Providers (APPs- Physician Assistants and Nurse Practitioners) who all work together to provide you with the care you need, when you need it.   You may see any of the following providers on your designated Care Team at your next follow up: Dr Jules Oar Dr Peder Bourdon Dr. Alwin Baars Dr. Arta Lark Amy Marijane Shoulders, NP Ruddy Corral, [place] [facility] [place], [place] Dennise Fitz, NP [place] Lee, NP Shawnee Dellen, NP Luster Salters, PharmD Bevely Brush, PharmD   Please be sure to bring in all your medications bottles to every appointment.    Thank you for choosing [facility]Advanced [facility] Failure [facility]

## 2023-05-15 NOTE — Research (Signed)
 SITE: [id number]     Subject # [id number]   Subprotocol: A  Inclusion Criteria  Patients who meet all of the following criteria are eligible for enrollment as study participants:  Yes No  Age > 58 years old X   Eligible to wear Holter Study X    Exclusion Criteria  Patients who meet any of these criteria are not eligible for enrollment as study participants: Yes No  1. Receiving any mechanical (respiratory or circulatory) or renal support therapy at Screening or during Visit #1.  X  2.  Any other conditions that in the opinion of the investigators are likely to prevent compliance with the study protocol or pose a safety concern if the subject participates in the study.  X  3. Poor tolerance, namely susceptible to severe skin allergies from ECG adhesive patch application.  X   Protocol: REV H                                     Residential Zip [address] (First 3 digits ONLY)                                             [employer] Informed Consent   Subject Name: John Velasquez  Subject met inclusion and exclusion criteria.  The informed consent form, study requirements and expectations were reviewed with the subject. Subject had opportunity to read consent and questions and concerns were addressed prior to the signing of the consent form.  The subject verbalized understanding of the trial requirements.  The subject agreed to participate in the [employer] EF ACT trial and signed the informed consent at 09:04 on 15-May-2023.  The informed consent was obtained prior to performance of any protocol-specific procedures for the subject.  A copy of the signed informed consent was given to the subject and a copy was placed in the subject's medical record.   Jolee Naval          Current Outpatient Medications:    atorvastatin (LIPITOR) 40 MG tablet, Take 1 tablet (40 mg total) by mouth daily., Disp: 30 tablet, Rfl: 3   carvedilol (COREG) 12.5 MG tablet, Take 1 tablet (12.5 mg total) by mouth 2 (two)  times daily with a meal., Disp: 60 tablet, Rfl: 3   dapagliflozin propanediol (FARXIGA) 10 MG TABS tablet, Take 1 tablet (10 mg total) by mouth daily., Disp: 30 tablet, Rfl: 3   furosemide (LASIX) 20 MG tablet, Take 1 tablet (20 mg total) by mouth every other day., Disp: , Rfl:    sacubitril-valsartan (ENTRESTO) 97-103 MG, Take 1 tablet by mouth 2 (two) times daily., Disp: 60 tablet, Rfl: 3   spironolactone (ALDACTONE) 25 MG tablet, Take 1 tablet (25 mg total) by mouth daily., Disp: 30 tablet, Rfl: 3

## 2023-05-30 ENCOUNTER — Other Ambulatory Visit: Payer: Self-pay

## 2023-05-31 ENCOUNTER — Other Ambulatory Visit (HOSPITAL_COMMUNITY): Payer: Self-pay

## 2023-07-04 ENCOUNTER — Other Ambulatory Visit (HOSPITAL_COMMUNITY): Payer: Self-pay

## 2023-08-06 ENCOUNTER — Other Ambulatory Visit (HOSPITAL_COMMUNITY): Payer: Self-pay | Admitting: Cardiology

## 2023-08-06 ENCOUNTER — Other Ambulatory Visit (HOSPITAL_COMMUNITY): Payer: Self-pay

## 2023-08-06 MED ORDER — CARVEDILOL 12.5 MG PO TABS
12.5000 mg | ORAL_TABLET | Freq: Two times a day (BID) | ORAL | 3 refills | Status: DC
  Filled 2023-08-06: qty 60, 30d supply, fill #0
  Filled 2023-08-29: qty 60, 30d supply, fill #1
  Filled 2023-09-28: qty 60, 30d supply, fill #2
  Filled 2023-10-30: qty 60, 30d supply, fill #3

## 2023-08-06 MED ORDER — SPIRONOLACTONE 25 MG PO TABS
25.0000 mg | ORAL_TABLET | Freq: Every day | ORAL | 3 refills | Status: DC
Start: 2023-08-06 — End: 2023-12-03
  Filled 2023-08-06: qty 30, 30d supply, fill #0
  Filled 2023-08-29: qty 30, 30d supply, fill #1
  Filled 2023-09-28: qty 30, 30d supply, fill #2
  Filled 2023-10-30: qty 30, 30d supply, fill #3

## 2023-08-06 MED ORDER — DAPAGLIFLOZIN PROPANEDIOL 10 MG PO TABS
10.0000 mg | ORAL_TABLET | Freq: Every day | ORAL | 3 refills | Status: DC
Start: 2023-08-06 — End: 2023-12-03
  Filled 2023-08-06: qty 30, 30d supply, fill #0
  Filled 2023-08-29: qty 30, 30d supply, fill #1
  Filled 2023-09-28: qty 30, 30d supply, fill #2
  Filled 2023-10-30: qty 30, 30d supply, fill #3

## 2023-08-06 MED ORDER — SACUBITRIL-VALSARTAN 97-103 MG PO TABS
1.0000 | ORAL_TABLET | Freq: Two times a day (BID) | ORAL | 3 refills | Status: DC
  Filled 2023-08-06: qty 60, 30d supply, fill #0
  Filled 2023-08-29: qty 60, 30d supply, fill #1
  Filled 2023-09-28: qty 60, 30d supply, fill #2
  Filled 2023-10-30 (×2): qty 60, 30d supply, fill #3

## 2023-08-29 ENCOUNTER — Other Ambulatory Visit (HOSPITAL_COMMUNITY): Payer: Self-pay

## 2023-08-29 ENCOUNTER — Other Ambulatory Visit (HOSPITAL_COMMUNITY): Payer: Self-pay | Admitting: Cardiology

## 2023-08-29 DIAGNOSIS — I5023 Acute on chronic systolic (congestive) heart failure: Secondary | ICD-10-CM

## 2023-08-30 ENCOUNTER — Other Ambulatory Visit (HOSPITAL_COMMUNITY): Payer: Self-pay

## 2023-08-30 MED ORDER — ATORVASTATIN CALCIUM 40 MG PO TABS
40.0000 mg | ORAL_TABLET | Freq: Every day | ORAL | 3 refills | Status: DC
  Filled 2023-08-30: qty 30, 30d supply, fill #0
  Filled 2023-09-28: qty 30, 30d supply, fill #1
  Filled 2023-10-30: qty 30, 30d supply, fill #2
  Filled 2023-12-03: qty 30, 30d supply, fill #3

## 2023-08-30 MED ORDER — FUROSEMIDE 20 MG PO TABS
20.0000 mg | ORAL_TABLET | ORAL | 3 refills | Status: DC
  Filled 2023-08-30: qty 15, 30d supply, fill #0
  Filled 2023-09-28: qty 15, 30d supply, fill #1

## 2023-09-27 ENCOUNTER — Telehealth (HOSPITAL_COMMUNITY): Payer: Self-pay | Admitting: Cardiology

## 2023-09-27 NOTE — Telephone Encounter (Signed)
 Called to confirm/remind patient of their appointment at the Advanced Heart Failure Clinic on 09/27/23.   Appointment:   [] Confirmed  [x] Left mess   [] No answer/No voice mail  [] VM Full/unable to leave message  [] Phone not in service  Patient reminded to bring all medications and/or complete list.  Confirmed patient has transportation. Gave directions, instructed to utilize valet parking.

## 2023-09-28 ENCOUNTER — Ambulatory Visit (HOSPITAL_COMMUNITY)
Admission: RE | Admit: 2023-09-28 | Discharge: 2023-09-28 | Disposition: A | Discharge status: Home / Self Care | Source: Ambulatory Visit | Attending: Cardiology | Admitting: Cardiology

## 2023-09-28 ENCOUNTER — Other Ambulatory Visit (HOSPITAL_COMMUNITY): Payer: Self-pay

## 2023-09-28 VITALS — BP 112/86 | HR 76 | Wt 208.6 lb

## 2023-09-28 DIAGNOSIS — Z7984 Long term (current) use of oral hypoglycemic drugs: Secondary | ICD-10-CM | POA: Insufficient documentation

## 2023-09-28 DIAGNOSIS — I5022 Chronic systolic (congestive) heart failure: Secondary | ICD-10-CM | POA: Insufficient documentation

## 2023-09-28 DIAGNOSIS — I10 Essential (primary) hypertension: Secondary | ICD-10-CM

## 2023-09-28 DIAGNOSIS — I428 Other cardiomyopathies: Secondary | ICD-10-CM | POA: Diagnosis not present

## 2023-09-28 DIAGNOSIS — I110 Hypertensive heart disease with heart failure: Secondary | ICD-10-CM | POA: Insufficient documentation

## 2023-09-28 DIAGNOSIS — E785 Hyperlipidemia, unspecified: Secondary | ICD-10-CM | POA: Diagnosis not present

## 2023-09-28 DIAGNOSIS — Z720 Tobacco use: Secondary | ICD-10-CM

## 2023-09-28 DIAGNOSIS — Z716 Tobacco abuse counseling: Secondary | ICD-10-CM

## 2023-09-28 DIAGNOSIS — F17210 Nicotine dependence, cigarettes, uncomplicated: Secondary | ICD-10-CM | POA: Diagnosis not present

## 2023-09-28 DIAGNOSIS — Z79899 Other long term (current) drug therapy: Secondary | ICD-10-CM | POA: Diagnosis not present

## 2023-09-28 LAB — BASIC METABOLIC PANEL WITH GFR
Anion gap: 13 (ref 5–15)
BUN: 15 mg/dL (ref 6–20)
CO2: 24 mmol/L (ref 22–32)
Calcium: 9.3 mg/dL (ref 8.9–10.3)
Chloride: 100 mmol/L (ref 98–111)
Creatinine, Ser: 1.03 mg/dL (ref 0.61–1.24)
GFR, Estimated: >60 mL/min (ref >60)
Glucose, Bld: 92 mg/dL (ref 70–99)
Potassium: 4.2 mmol/L (ref 3.5–5.1)
Sodium: 137 mmol/L (ref 135–145)

## 2023-09-28 LAB — BRAIN NATRIURETIC PEPTIDE: B Natriuretic Peptide: 31.4 pg/mL (ref 0.0–100.0)

## 2023-09-28 MED ORDER — NICOTINE POLACRILEX 4 MG MT GUM
4.0000 mg | CHEWING_GUM | OROMUCOSAL | 0 refills | Status: AC | PRN
  Filled 2023-09-28: qty 110, 11d supply, fill #0

## 2023-09-28 MED ORDER — FUROSEMIDE 20 MG PO TABS: 20.0000 mg | ORAL_TABLET | ORAL | 3 refills | Status: AC

## 2023-09-28 NOTE — Patient Instructions (Signed)
 Medication Changes:  DECREASE FUROSEMIDE  TO 20MG  EVERY OTHER DAY   NICOTINE  GUM SENT TO PHARMACY   Lab Work:  Labs done today, your results will be available in MyChart, we will contact you for abnormal readings.   Follow-Up in: 4 MONTHS WITH AN ECHO PLEASE CALL OUR OFFICE AROUND OCTOBER TO GET SCHEDULED FOR YOUR APPOINTMENT. PHONE NUMBER IS [phone] OPTION 2   At the Advanced Heart Failure Clinic, you and your health needs are our priority. We have a designated team specialized in the treatment of Heart Failure. This Care Team includes your primary Heart Failure Specialized Cardiologist (physician), Advanced Practice Providers (APPs- Physician Assistants and Nurse Practitioners), and Pharmacist who all work together to provide you with the care you need, when you need it.   You may see any of the following providers on your designated Care Team at your next follow up:  Dr. Toribio Fuel Dr. Ezra Shuck Dr. Ria Commander Dr. Odis Brownie Greig Mosses, NP Caffie Shed, [place] [facility] [place], [place] Beckey Coe, NP [place] Lee, NP Tinnie Redman, PharmD   Please be sure to bring in all your medications bottles to every appointment.   Need to Contact Us :  If you have any questions or concerns before your next appointment please send us  a message through [place] or call our office at [phone].    TO LEAVE A MESSAGE FOR THE NURSE SELECT OPTION 2, PLEASE LEAVE A MESSAGE INCLUDING: YOUR NAME DATE OF BIRTH CALL BACK NUMBER REASON FOR CALL**this is important as we prioritize the call backs  YOU WILL RECEIVE A CALL BACK THE SAME DAY AS LONG AS YOU CALL BEFORE 4:00 PM

## 2023-09-28 NOTE — Addendum Note (Signed)
 Encounter addended by: Tita Andriette NOVAK, RN on: 09/28/2023 2:21 PM  Actions taken: Pend clinical note, Visit diagnoses modified, Order list changed, Diagnosis association updated, Clinical Note Signed, Charge Capture section accepted

## 2023-09-28 NOTE — Progress Notes (Signed)
 ADVANCED HEART FAILURE CLINIC NOTE  Referring Physician: Rexanne Ingle, MD  Primary Care: Rexanne Ingle, MD HF: Dr. Gardenia  CC: Chronic systolic heart failure  HPI: John Velasquez is a 58 y.o. male with uncontrolled hypertension, tobacco use and recently diagnosed systolic heart failure presenting today to establish care.  I saw John Velasquez at [facility] after he presented with a 2-week history of substernal chest pressure, shortness of breath and paroxysmal nocturnal dyspnea.  During that admission he was found to have an LVEF of 20 to 25%, cardiac index of 2.1 by right heart cath and no angiographically significant coronary artery disease.  John Velasquez was started on appropriate GDMT, diuresed and discharged home.  In addition he had a cardiac MRI with non coronary LGE pattern concerning for old myocarditis   Interval history -Reports compliance with all medications. No shortness of breath, LE edema  Past Medical History:  Diagnosis Date   Cellulitis 04/13/2017   BILATERAL FEET   Dyslipidemia 12/25/2022   Hypertension     Current Outpatient Medications  Medication Sig Dispense Refill   atorvastatin  (LIPITOR) 40 MG tablet Take 1 tablet (40 mg total) by mouth daily. 30 tablet 3   carvedilol  (COREG ) 12.5 MG tablet Take 1 tablet (12.5 mg total) by mouth 2 (two) times daily with a meal. 60 tablet 3   dapagliflozin  propanediol (FARXIGA ) 10 MG TABS tablet Take 1 tablet (10 mg total) by mouth daily. 30 tablet 3   furosemide  (LASIX ) 20 MG tablet Take 1 tablet (20 mg total) by mouth every other day.     sacubitril -valsartan  (ENTRESTO ) 97-103 MG Take 1 tablet by mouth 2 (two) times daily. 60 tablet 3   spironolactone  (ALDACTONE ) 25 MG tablet Take 1 tablet (25 mg total) by mouth daily. 30 tablet 3   No current facility-administered medications for this encounter.     PHYSICAL EXAM: Vitals:   09/28/23 1350  BP: 112/86  Pulse: 76  SpO2: 97%   GENERAL: NAD Lungs-  CTA CARDIAC:  JVP: 7 cm          Normal rate with regular rhythm. no murmur.  Pulses 2+. no edema.  ABDOMEN: Soft, non-tender, non-distended.  EXTREMITIES: Warm and well perfused.  NEUROLOGIC: No obvious FND    DATA REVIEW  ECG: 11/22/21: NSR w/ PAC  ECHO: 11/07/21: LVEF 20-25%, Grade II DD, normal RV function; severely dilated LA.  12/25/22: LVEF 20-25%,  05/15/23: LVEF 50%  CATH: 11/08/21: HEMODYNAMICS: RA:                  2 mmHg (mean) RV:                  18 mmHg PA:                  14/5 mmHg (8 mean) PCWP:            7 mmHg (mean) LVEDP: 3-5                                      Estimated Fick CO/CI   4.25 L/min, 2.07 L/min/m2                                          TPG  1  mmHg                                              PVR                 <1 Wood Units  PAPi                4.5       IMPRESSION: 1.           Low pre and post capillary filling pressures, PCWP or PA diastolic likely underestimated.  2.         Moderately reduced cardiac index 3.         Normal PVR 4.         Right dominant coronary circulation with no angiography significant CAD.   CMR: 11/08/21: 1.  Mild LV dilation with EF 16%, global hypokinesis.  2.  Normal RV size with EF 27%.  3. Non-coronary LGE pattern with mid-wall LGE in the basal to mid septum and mid-wall and subepicardial LGE in the basal to mid lateral wall. This could suggest prior myocarditis. T2 signal not elevated in the septum or lateral wall, making acute myocarditis less likely.  4. ECV percentage mildly elevated at 35, this range is not suggestive of cardiac amyloidosis (suggestive when > 40).  ASSESSMENT & PLAN:  NYHA II, Stage C, Nonischemic cardiomyopathy / HFrEF Etiology of YQ:Opxzob 2/2 hypertensive cardiomyopathy; LHC unremarkable; mildly reduced CI by RHC. No significant family history of heart disease.  NYHA class / AHA Stage:II Volume status & Diuretics: euvolemic to hypovolemic on exam;  previously decreased lasix  to 20mg  every other day; however, he has continued to take it daily due to concerns for hypovolemia. Will reduce to every other day once more. Repeat BMP/BNP today. BNP on 05/15/23: 33.8.  Vasodilators:Continue Entresto  97/103 BID; BP at goal today.  Beta-Blocker: Continue Coreg  to 12.5mg  BID; HR at goal today.  MRA: Continue spironolactone  25mg  daily. Repeat labs.  Cardiometabolic: Continue Farxiga  Devices therapies & Valvulopathies: Recovery of LVEF on echo today. 45-50%. Repeat TTE at follow up.  Advanced therapies: Not indicated  2.  Hypertension -BP at goal -BMP/BNP   3.  Tobacco use -Continues to smoke; however, he is using nicotine  gum now to try to stop. Will rx gum for him today. - He wants to quit.   4. Hyperlipidemia - LDL 106 in 11/07/21 - LDL down to 64 in 3/25.  - repeat at follow up.   I spent 40 minutes caring for this patient today including face to face time, ordering and reviewing labs, reviewing labs with him, counseling on smoking cessation, seeing the patient, documenting in the record, and arranging follow ups.    Maureen Delatte Advanced Heart Failure Mechanical Circulatory Support

## 2023-10-10 ENCOUNTER — Other Ambulatory Visit: Payer: Self-pay

## 2023-10-30 ENCOUNTER — Other Ambulatory Visit (HOSPITAL_COMMUNITY): Payer: Self-pay

## 2023-10-30 ENCOUNTER — Other Ambulatory Visit (HOSPITAL_COMMUNITY): Payer: Self-pay | Admitting: Cardiology

## 2023-10-30 DIAGNOSIS — I5023 Acute on chronic systolic (congestive) heart failure: Secondary | ICD-10-CM

## 2023-10-30 MED ORDER — FUROSEMIDE 20 MG PO TABS
20.0000 mg | ORAL_TABLET | ORAL | 3 refills | Status: AC
  Filled 2023-10-30: qty 15, 30d supply, fill #0
  Filled 2023-12-03: qty 15, 30d supply, fill #1
  Filled 2024-01-07: qty 15, 30d supply, fill #2
  Filled 2024-02-08: qty 15, 30d supply, fill #3

## 2023-12-03 ENCOUNTER — Other Ambulatory Visit (HOSPITAL_COMMUNITY): Payer: Self-pay | Admitting: Cardiology

## 2023-12-03 ENCOUNTER — Other Ambulatory Visit (HOSPITAL_COMMUNITY): Payer: Self-pay

## 2023-12-03 ENCOUNTER — Other Ambulatory Visit: Payer: Self-pay

## 2023-12-03 MED ORDER — SPIRONOLACTONE 25 MG PO TABS
25.0000 mg | ORAL_TABLET | Freq: Every day | ORAL | 3 refills | Status: AC
  Filled 2023-12-03: qty 30, 30d supply, fill #0
  Filled 2024-01-07: qty 30, 30d supply, fill #1
  Filled 2024-02-08: qty 30, 30d supply, fill #2

## 2023-12-03 MED ORDER — DAPAGLIFLOZIN PROPANEDIOL 10 MG PO TABS
10.0000 mg | ORAL_TABLET | Freq: Every day | ORAL | 3 refills | Status: AC
  Filled 2023-12-03: qty 30, 30d supply, fill #0
  Filled 2024-01-07: qty 30, 30d supply, fill #1
  Filled 2024-02-08: qty 30, 30d supply, fill #2

## 2023-12-03 MED ORDER — CARVEDILOL 12.5 MG PO TABS
12.5000 mg | ORAL_TABLET | Freq: Two times a day (BID) | ORAL | 3 refills | Status: AC
  Filled 2023-12-03: qty 60, 30d supply, fill #0
  Filled 2024-01-07: qty 60, 30d supply, fill #1
  Filled 2024-02-08: qty 60, 30d supply, fill #2

## 2023-12-03 MED ORDER — SACUBITRIL-VALSARTAN 97-103 MG PO TABS
1.0000 | ORAL_TABLET | Freq: Two times a day (BID) | ORAL | 3 refills | Status: AC
  Filled 2023-12-03: qty 60, 30d supply, fill #0
  Filled 2024-01-07: qty 60, 30d supply, fill #1
  Filled 2024-02-08: qty 60, 30d supply, fill #2

## 2023-12-04 ENCOUNTER — Other Ambulatory Visit (HOSPITAL_COMMUNITY): Payer: Self-pay

## 2024-01-07 ENCOUNTER — Other Ambulatory Visit (HOSPITAL_COMMUNITY): Payer: Self-pay

## 2024-01-07 ENCOUNTER — Other Ambulatory Visit (HOSPITAL_COMMUNITY): Payer: Self-pay | Admitting: Internal Medicine

## 2024-01-07 DIAGNOSIS — I5023 Acute on chronic systolic (congestive) heart failure: Secondary | ICD-10-CM

## 2024-01-08 ENCOUNTER — Other Ambulatory Visit (HOSPITAL_COMMUNITY): Payer: Self-pay

## 2024-01-08 MED ORDER — ATORVASTATIN CALCIUM 40 MG PO TABS
40.0000 mg | ORAL_TABLET | Freq: Every day | ORAL | 3 refills | Status: AC
  Filled 2024-01-08: qty 30, 30d supply, fill #0
  Filled 2024-02-08: qty 30, 30d supply, fill #1

## 2024-01-16 ENCOUNTER — Telehealth (HOSPITAL_COMMUNITY): Payer: Self-pay | Admitting: Cardiology

## 2024-01-22 ENCOUNTER — Encounter (HOSPITAL_COMMUNITY): Admitting: Cardiology

## 2024-01-22 ENCOUNTER — Other Ambulatory Visit (HOSPITAL_COMMUNITY)

## 2024-01-23 ENCOUNTER — Encounter (HOSPITAL_COMMUNITY): Admitting: Cardiology

## 2024-01-23 ENCOUNTER — Ambulatory Visit (HOSPITAL_COMMUNITY)

## 2024-02-08 ENCOUNTER — Other Ambulatory Visit (HOSPITAL_COMMUNITY): Payer: Self-pay

## 2024-02-25 ENCOUNTER — Ambulatory Visit (HOSPITAL_COMMUNITY): Admitting: Internal Medicine

## 2024-02-25 ENCOUNTER — Ambulatory Visit (HOSPITAL_COMMUNITY)

## 2024-05-16 ENCOUNTER — Other Ambulatory Visit (HOSPITAL_COMMUNITY)

## 2024-05-16 ENCOUNTER — Ambulatory Visit (HOSPITAL_COMMUNITY): Admitting: Internal Medicine
